# Patient Record
Sex: Female | Born: 1935 | Race: White | Hispanic: No | Smoking: Never smoker
Health system: Southern US, Community
[De-identification: ages and names within clinical notes are randomized; demographics above are authoritative.]

## PROBLEM LIST (undated history)

## (undated) DIAGNOSIS — R079 Chest pain, unspecified: Secondary | ICD-10-CM

## (undated) DIAGNOSIS — I6522 Occlusion and stenosis of left carotid artery: Secondary | ICD-10-CM

## (undated) DIAGNOSIS — K219 Gastro-esophageal reflux disease without esophagitis: Secondary | ICD-10-CM

## (undated) DIAGNOSIS — E079 Disorder of thyroid, unspecified: Secondary | ICD-10-CM

## (undated) DIAGNOSIS — F329 Major depressive disorder, single episode, unspecified: Secondary | ICD-10-CM

## (undated) DIAGNOSIS — I10 Essential (primary) hypertension: Secondary | ICD-10-CM

## (undated) DIAGNOSIS — F32A Depression, unspecified: Secondary | ICD-10-CM

## (undated) DIAGNOSIS — H269 Unspecified cataract: Secondary | ICD-10-CM

## (undated) HISTORY — DX: Disorder of thyroid, unspecified: E07.9

## (undated) HISTORY — PX: KNEE ARTHROSCOPY: SUR90

## (undated) HISTORY — DX: Chest pain, unspecified: R07.9

## (undated) HISTORY — PX: JOINT REPLACEMENT: SHX530

## (undated) HISTORY — DX: Gastro-esophageal reflux disease without esophagitis: K21.9

## (undated) HISTORY — PX: SPINE SURGERY: SHX786

## (undated) HISTORY — PX: ABDOMINAL HYSTERECTOMY: SHX81

## (undated) HISTORY — DX: Occlusion and stenosis of left carotid artery: I65.22

## (undated) HISTORY — PX: NOSE SURGERY: SHX723

## (undated) HISTORY — PX: LIPOMA EXCISION: SHX5283

## (undated) HISTORY — DX: Major depressive disorder, single episode, unspecified: F32.9

## (undated) HISTORY — DX: Unspecified cataract: H26.9

## (undated) HISTORY — DX: Depression, unspecified: F32.A

---
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
## 1997-08-16 ENCOUNTER — Ambulatory Visit (HOSPITAL_COMMUNITY): Admission: RE | Admit: 1997-08-16 | Discharge: 1997-08-17 | Payer: Self-pay | Admitting: Urology

## 1997-11-22 ENCOUNTER — Ambulatory Visit (HOSPITAL_BASED_OUTPATIENT_CLINIC_OR_DEPARTMENT_OTHER): Admission: RE | Admit: 1997-11-22 | Discharge: 1997-11-22 | Payer: Self-pay | Admitting: Urology

## 1998-10-17 ENCOUNTER — Other Ambulatory Visit: Admission: RE | Admit: 1998-10-17 | Discharge: 1998-10-17 | Payer: Self-pay | Admitting: Internal Medicine

## 1999-04-25 ENCOUNTER — Ambulatory Visit (HOSPITAL_COMMUNITY): Admission: RE | Admit: 1999-04-25 | Discharge: 1999-04-25 | Payer: Self-pay | Admitting: Gastroenterology

## 2002-09-10 ENCOUNTER — Encounter: Payer: Self-pay | Admitting: Internal Medicine

## 2002-09-10 ENCOUNTER — Ambulatory Visit (HOSPITAL_COMMUNITY): Admission: RE | Admit: 2002-09-10 | Discharge: 2002-09-10 | Payer: Self-pay | Admitting: Internal Medicine

## 2002-11-03 ENCOUNTER — Encounter: Payer: Self-pay | Admitting: General Surgery

## 2002-11-10 ENCOUNTER — Ambulatory Visit (HOSPITAL_COMMUNITY): Admission: RE | Admit: 2002-11-10 | Discharge: 2002-11-10 | Payer: Self-pay | Admitting: General Surgery

## 2010-06-23 DIAGNOSIS — I10 Essential (primary) hypertension: Secondary | ICD-10-CM

## 2010-06-23 HISTORY — DX: Essential (primary) hypertension: I10

## 2014-12-13 ENCOUNTER — Ambulatory Visit (INDEPENDENT_AMBULATORY_CARE_PROVIDER_SITE_OTHER): Payer: Medicare Other | Admitting: Emergency Medicine

## 2014-12-13 ENCOUNTER — Encounter: Payer: Self-pay | Admitting: Emergency Medicine

## 2014-12-13 VITALS — BP 148/84 | HR 93 | Temp 98.4°F | Resp 18 | Ht 63.0 in | Wt 211.5 lb

## 2014-12-13 DIAGNOSIS — N3090 Cystitis, unspecified without hematuria: Secondary | ICD-10-CM

## 2014-12-13 LAB — POCT URINALYSIS DIPSTICK
Bilirubin, UA: NEGATIVE
GLUCOSE UA: NEGATIVE
KETONES UA: NEGATIVE
Nitrite, UA: POSITIVE
SPEC GRAV UA: 1.01
UROBILINOGEN UA: 0.2
pH, UA: 6

## 2014-12-13 LAB — POCT UA - MICROSCOPIC ONLY
CRYSTALS, UR, HPF, POC: NEGATIVE
Casts, Ur, LPF, POC: NEGATIVE
Mucus, UA: NEGATIVE
Yeast, UA: NEGATIVE

## 2014-12-13 MED ORDER — PHENAZOPYRIDINE HCL 200 MG PO TABS: 200.0000 mg | ORAL_TABLET | Freq: Three times a day (TID) | ORAL | Status: DC | PRN

## 2014-12-13 MED ORDER — SULFAMETHOXAZOLE-TRIMETHOPRIM 800-160 MG PO TABS: 1.0000 | ORAL_TABLET | Freq: Two times a day (BID) | ORAL | Status: DC

## 2014-12-13 NOTE — Progress Notes (Signed)
Subjective:  Patient ID: Jean King, female    DOB: 06-25-1935  Age: 79 y.o. MRN: [id number]  CC: Back Pain and Dysuria   HPI Jean King presents  with dysuria urgency and frequency over several days. She now today has developed hematuria. She denies any fever chills nausea vomiting. History of prior urinary tract infections. She has no back pain. No vaginal bleeding no vaginal discharge. She's had no improvement with over-the-counter medication  History Jean King has a past medical history of Asthma; Cataract; Depression; GERD (gastroesophageal reflux disease); and Thyroid disease.   She has past surgical history that includes Spine surgery; Joint replacement; Abdominal hysterectomy; and Coronary artery bypass graft.   Her  family history includes Cancer in her mother and sister; Heart disease in her brother and father; Stroke in her father.  She   reports that she has never smoked. She does not have any smokeless tobacco history on file. Her alcohol and drug histories are not on file.  No outpatient prescriptions prior to visit.   No facility-administered medications prior to visit.    Social History   Social History  . Marital Status: Widowed    Spouse Name: N/A  . Number of Children: N/A  . Years of Education: N/A   Social History Main Topics  . Smoking status: Never Smoker   . Smokeless tobacco: None  . Alcohol Use: None  . Drug Use: None  . Sexual Activity: Not Asked   Other Topics Concern  . None   Social History Narrative  . None     Review of Systems  Constitutional: Negative for fever, chills and appetite change.  HENT: Negative for congestion, ear pain, postnasal drip, sinus pressure and sore throat.   Eyes: Negative for pain and redness.  Respiratory: Negative for cough, shortness of breath and wheezing.   Cardiovascular: Negative for leg swelling.  Gastrointestinal: Negative for nausea, vomiting, abdominal pain, diarrhea, constipation and blood  in stool.  Endocrine: Negative for polyuria.  Genitourinary: Positive for dysuria, urgency, frequency and hematuria. Negative for flank pain.  Musculoskeletal: Negative for gait problem.  Skin: Negative for rash.  Neurological: Negative for weakness and headaches.  Psychiatric/Behavioral: Negative for confusion and decreased concentration. The patient is not nervous/anxious.     Objective:  BP 148/84 mmHg  Pulse 93  Temp(Src) 98.4 F (36.9 C) (Oral)  Resp 18  Ht 5\' 3"  (1.6 m)  Wt 211 lb 7.5 oz (95.922 kg)  BMI 37.47 kg/m2  SpO2 92%  Physical Exam  Constitutional: She is oriented to person, place, and time. She appears well-developed and well-nourished. No distress.  HENT:  Head: Normocephalic and atraumatic.  Right Ear: External ear normal.  Left Ear: External ear normal.  Nose: Nose normal.  Eyes: Conjunctivae and EOM are normal. Pupils are equal, round, and reactive to light. No scleral icterus.  Neck: Normal range of motion. Neck supple. No tracheal deviation present.  Cardiovascular: Normal rate, regular rhythm and normal heart sounds.   Pulmonary/Chest: Effort normal. No respiratory distress. She has no wheezes. She has no rales.  Abdominal: She exhibits no mass. There is no tenderness. There is no rebound and no guarding.  Musculoskeletal: She exhibits no edema.  Lymphadenopathy:    She has no cervical adenopathy.  Neurological: She is alert and oriented to person, place, and time. Coordination normal.  Skin: Skin is warm and dry. No rash noted.  Psychiatric: She has a normal mood and affect. Her behavior is normal.  Assessment & Plan:   Jean King was seen today for back pain and dysuria.  Diagnoses and all orders for this visit:  Cystitis -     POCT UA - Microscopic Only -     POCT urinalysis dipstick  Other orders -     phenazopyridine (PYRIDIUM) 200 MG tablet; Take 1 tablet (200 mg total) by mouth 3 (three) times daily as needed. -      sulfamethoxazole-trimethoprim (BACTRIM DS,SEPTRA DS) 800-160 MG per tablet; Take 1 tablet by mouth 2 (two) times daily.   I am having Jean King start on phenazopyridine and sulfamethoxazole-trimethoprim. I am also having her maintain her SIMVASTATIN PO, gabapentin, LEVOTHYROXINE SODIUM PO, and Escitalopram Oxalate (LEXAPRO PO).  Meds ordered this encounter  Medications  . SIMVASTATIN PO    Sig: Take by mouth.  . gabapentin (NEURONTIN) 100 MG capsule    Sig: Take 100 mg by mouth 3 (three) times daily.  Marland Kitchen LEVOTHYROXINE SODIUM PO    Sig: Take by mouth.  . Escitalopram Oxalate (LEXAPRO PO)    Sig: Take by mouth.  . phenazopyridine (PYRIDIUM) 200 MG tablet    Sig: Take 1 tablet (200 mg total) by mouth 3 (three) times daily as needed.    Dispense:  6 tablet    Refill:  0  . sulfamethoxazole-trimethoprim (BACTRIM DS,SEPTRA DS) 800-160 MG per tablet    Sig: Take 1 tablet by mouth 2 (two) times daily.    Dispense:  20 tablet    Refill:  0    Appropriate red flag conditions were discussed with the patient as well as actions that should be taken.  Patient expressed his understanding.  Follow-up: No Follow-up on file.  Roselee Culver, MD    Results for orders placed or performed in visit on 12/13/14  POCT UA - Microscopic Only  Result Value Ref Range   WBC, Ur, HPF, POC TNTC    RBC, urine, microscopic 15-20    Bacteria, U Microscopic large    Mucus, UA neg    Epithelial cells, urine per micros 3-5    Crystals, Ur, HPF, POC neg    Casts, Ur, LPF, POC neg    Yeast, UA neg   POCT urinalysis dipstick  Result Value Ref Range   Color, UA yellow    Clarity, UA clear    Glucose, UA neg    Bilirubin, UA neg    Ketones, UA neg    Spec Grav, UA 1.010    Blood, UA large    pH, UA 6.0    Protein, UA trace    Urobilinogen, UA 0.2    Nitrite, UA positive    Leukocytes, UA moderate (2+) (A) Negative

## 2014-12-13 NOTE — Patient Instructions (Signed)

## 2015-05-22 ENCOUNTER — Other Ambulatory Visit: Payer: Self-pay | Admitting: Internal Medicine

## 2015-05-22 DIAGNOSIS — Z1231 Encounter for screening mammogram for malignant neoplasm of breast: Secondary | ICD-10-CM

## 2015-05-31 ENCOUNTER — Ambulatory Visit
Admission: RE | Admit: 2015-05-31 | Discharge: 2015-05-31 | Disposition: A | Discharge status: Home / Self Care | Payer: Medicare Other | Source: Ambulatory Visit | Attending: Internal Medicine | Admitting: Internal Medicine

## 2015-05-31 DIAGNOSIS — Z1231 Encounter for screening mammogram for malignant neoplasm of breast: Secondary | ICD-10-CM

## 2015-06-15 ENCOUNTER — Emergency Department (HOSPITAL_COMMUNITY): Payer: Medicare Other

## 2015-06-15 ENCOUNTER — Emergency Department (HOSPITAL_COMMUNITY)
Admission: EM | Admit: 2015-06-15 | Discharge: 2015-06-16 | Disposition: A | Discharge status: Home / Self Care | Payer: Medicare Other | Attending: Emergency Medicine | Admitting: Emergency Medicine

## 2015-06-15 ENCOUNTER — Encounter (HOSPITAL_COMMUNITY): Payer: Self-pay | Admitting: Emergency Medicine

## 2015-06-15 DIAGNOSIS — Z86711 Personal history of pulmonary embolism: Secondary | ICD-10-CM | POA: Diagnosis not present

## 2015-06-15 DIAGNOSIS — Z951 Presence of aortocoronary bypass graft: Secondary | ICD-10-CM | POA: Insufficient documentation

## 2015-06-15 DIAGNOSIS — Z8669 Personal history of other diseases of the nervous system and sense organs: Secondary | ICD-10-CM | POA: Insufficient documentation

## 2015-06-15 DIAGNOSIS — R0602 Shortness of breath: Secondary | ICD-10-CM

## 2015-06-15 DIAGNOSIS — Z8719 Personal history of other diseases of the digestive system: Secondary | ICD-10-CM | POA: Insufficient documentation

## 2015-06-15 DIAGNOSIS — Z79899 Other long term (current) drug therapy: Secondary | ICD-10-CM | POA: Insufficient documentation

## 2015-06-15 DIAGNOSIS — Z7982 Long term (current) use of aspirin: Secondary | ICD-10-CM | POA: Insufficient documentation

## 2015-06-15 DIAGNOSIS — J45901 Unspecified asthma with (acute) exacerbation: Secondary | ICD-10-CM | POA: Diagnosis not present

## 2015-06-15 DIAGNOSIS — E079 Disorder of thyroid, unspecified: Secondary | ICD-10-CM | POA: Insufficient documentation

## 2015-06-15 DIAGNOSIS — F329 Major depressive disorder, single episode, unspecified: Secondary | ICD-10-CM | POA: Insufficient documentation

## 2015-06-15 LAB — BASIC METABOLIC PANEL
Anion gap: 13 (ref 5–15)
BUN: 40 mg/dL — AB (ref 6–20)
CHLORIDE: 105 mmol/L (ref 101–111)
CO2: 23 mmol/L (ref 22–32)
CREATININE: 1.22 mg/dL — AB (ref 0.44–1.00)
Calcium: 10 mg/dL (ref 8.9–10.3)
GFR calc Af Amer: 48 mL/min — ABNORMAL LOW (ref >60)
GFR calc non Af Amer: 42 mL/min — ABNORMAL LOW (ref >60)
Glucose, Bld: 134 mg/dL — ABNORMAL HIGH (ref 65–99)
POTASSIUM: 4.5 mmol/L (ref 3.5–5.1)
SODIUM: 141 mmol/L (ref 135–145)

## 2015-06-15 LAB — CBC
HEMATOCRIT: 38.9 % (ref 36.0–46.0)
Hemoglobin: 13.1 g/dL (ref 12.0–15.0)
MCH: 30.5 pg (ref 26.0–34.0)
MCHC: 33.7 g/dL (ref 30.0–36.0)
MCV: 90.7 fL (ref 78.0–100.0)
PLATELETS: 354 10*3/uL (ref 150–400)
RBC: 4.29 MIL/uL (ref 3.87–5.11)
RDW: 13.5 % (ref 11.5–15.5)
WBC: 14.3 10*3/uL — AB (ref 4.0–10.5)

## 2015-06-15 LAB — I-STAT TROPONIN, ED: Troponin i, poc: 0 ng/mL (ref 0.00–0.08)

## 2015-06-15 MED ORDER — SODIUM CHLORIDE 0.9 % IV SOLN
Freq: Once | INTRAVENOUS | Status: AC
  Administered 2015-06-15: 22:00:00 via INTRAVENOUS

## 2015-06-15 MED ORDER — IOHEXOL 350 MG/ML SOLN
80.0000 mL | Freq: Once | INTRAVENOUS | Status: AC | PRN
  Administered 2015-06-15: 100 mL via INTRAVENOUS

## 2015-06-15 NOTE — ED Notes (Signed)
Pt stable, ambulatory, states understanding of discharge instructions 

## 2015-06-15 NOTE — ED Notes (Addendum)
Pt reports exertional dyspnea x 1 week. Pt reports that she was tested at her doctors for blood clots and the blood test came back positive, pt has hx of PE. Pt alert x4. NAD at this time. Pt also reports that her PCP reported that her kidneys weren't functioning right.

## 2015-06-15 NOTE — ED Notes (Signed)
Pt ambulated in hall. O2 Sat stayed within normal limits. Tolerated well.

## 2015-06-15 NOTE — ED Provider Notes (Signed)
CSN: [id number]     Arrival date & time 06/15/15  1416 History   First MD Initiated Contact with Patient 06/15/15 2120     Chief Complaint  Patient presents with  . Shortness of Breath     (Consider location/radiation/quality/duration/timing/severity/associated sxs/prior Treatment) HPI Comments: Patient is a 80 year old female with a history of asthma, esophageal reflux, and pulmonary embolus (hx 4 years ago, completed 6 mo course of coumadin). She presents to the emergency department for evaluation of dyspnea on exertion which has been present for the last week. Patient reports having worsening symptoms with ambulation. She also reports associated lightheadedness at times. She states that her symptoms reminded her of when she had a blood clot. She reports that her brother "died of a blood clot". Patient denies fever, chill, syncope, leg swelling, chest pain, N/V. She recently discontinued an Atkins diet which she had been doing x 2 weeks. She is unsure of whether this may be contributing to her symptoms.  Patient is a 80 y.o. female presenting with shortness of breath. The history is provided by the patient. No language interpreter was used.  Shortness of Breath   Past Medical History  Diagnosis Date  . Asthma   . Cataract   . Depression   . GERD (gastroesophageal reflux disease)   . Thyroid disease    Past Surgical History  Procedure Laterality Date  . Spine surgery    . Joint replacement    . Abdominal hysterectomy    . Coronary artery bypass graft     Family History  Problem Relation Age of Onset  . Cancer Mother   . Heart disease Father   . Stroke Father   . Cancer Sister   . Heart disease Brother    Social History  Substance Use Topics  . Smoking status: Never Smoker   . Smokeless tobacco: None  . Alcohol Use: None   OB History    No data available      Review of Systems  Respiratory: Positive for shortness of breath.   Neurological: Positive for  light-headedness.  All other systems reviewed and are negative.   Allergies  Chlorhexidine and Scopolamine  Home Medications   Prior to Admission medications   Medication Sig Start Date End Date Taking? Authorizing Provider  aspirin EC 81 MG tablet Take 81 mg by mouth at bedtime.   Yes Historical Provider, MD  ciprofloxacin (CIPRO) 500 MG tablet Take 500 mg by mouth 2 (two) times daily. 7 day course started 06/14/25 am   Yes Historical Provider, MD  escitalopram (LEXAPRO) 20 MG tablet Take 10 mg by mouth See admin instructions. Take 1/2 tablet (10 mg) every other day for approx one more week (06/15/15), pt is tapering down and off this medication   Yes Historical Provider, MD  gabapentin (NEURONTIN) 100 MG capsule Take 100 mg by mouth 3 (three) times daily.   Yes Historical Provider, MD  ibuprofen (ADVIL,MOTRIN) 200 MG tablet Take 400-600 mg by mouth at bedtime.   Yes Historical Provider, MD  levothyroxine (SYNTHROID, LEVOTHROID) 75 MCG tablet Take 75 mcg by mouth daily before breakfast. 05/24/15  Yes Historical Provider, MD  Multiple Vitamin (MULTIVITAMIN WITH MINERALS) TABS tablet Take 1 tablet by mouth daily. Centrum Silver Women over 50   Yes Historical Provider, MD  predniSONE (DELTASONE) 10 MG tablet Take 10-60 mg by mouth daily at 3 pm. Take 6 tablets (60 mg) by mouth 1st day (spaced throughout the day per pkg directions), take 5 tablets (  50 mg) 2nd day (spaced throughout the day), take 4 tablets (40 mg) 3rd day (spaced throughout the day), take 3 tablets (30 mg) 4th day (spaced throughout the day), then take 2 tablets (20 mg) 5th day, then take 1 tablet (10 mg) 6th day.   Yes Historical Provider, MD  simvastatin (ZOCOR) 20 MG tablet Take 20 mg by mouth at bedtime.   Yes Historical Provider, MD   BP 127/75 mmHg  Pulse 76  Temp(Src) 98.1 F (36.7 C) (Oral)  Resp 15  SpO2 99%   Physical Exam  Constitutional: She is oriented to person, place, and time. She appears well-developed and  well-nourished. No distress.  Nontoxic/nonseptic appearing. Pleasant.  HENT:  Head: Normocephalic and atraumatic.  Eyes: Conjunctivae and EOM are normal. No scleral icterus.  Neck: Normal range of motion.  Cardiovascular: Normal rate, regular rhythm and intact distal pulses.   Pulmonary/Chest: Effort normal and breath sounds normal. No respiratory distress. She has no wheezes. She has no rales.  No dyspnea or tachypnea. Lungs clear bilaterally.  Musculoskeletal: Normal range of motion.  Neurological: She is alert and oriented to person, place, and time. She exhibits normal muscle tone. Coordination normal.  Patient moving all extremities  Skin: Skin is warm and dry. No rash noted. She is not diaphoretic. No erythema. No pallor.  Psychiatric: She has a normal mood and affect. Her behavior is normal.  Nursing note and vitals reviewed.   ED Course  Procedures (including critical care time) Labs Review Labs Reviewed  BASIC METABOLIC PANEL - Abnormal; Notable for the following:    Glucose, Bld 134 (*)    BUN 40 (*)    Creatinine, Ser 1.22 (*)    GFR calc non Af Amer 42 (*)    GFR calc Af Amer 48 (*)    All other components within normal limits  CBC - Abnormal; Notable for the following:    WBC 14.3 (*)    All other components within normal limits  I-STAT TROPOININ, ED    Imaging Review Dg Chest 2 View  06/15/2015  CLINICAL DATA:  Shortness of breath and palpitations. Dyspnea for 1 week. EXAM: CHEST  2 VIEW COMPARISON:  None. FINDINGS: The heart size and mediastinal contours are within normal limits. Both lungs are clear. The visualized skeletal structures are unremarkable. IMPRESSION: No active cardiopulmonary disease. Electronically Signed   By: Kathreen Devoid   On: 06/15/2015 15:54   Ct Angio Chest Pe W/cm &/or Wo Cm  06/15/2015  CLINICAL DATA:  80 year old female with dyspnea and elevated D-dimer. EXAM: CT ANGIOGRAPHY CHEST WITH CONTRAST TECHNIQUE: Multidetector CT imaging of the  chest was performed using the standard protocol during bolus administration of intravenous contrast. Multiplanar CT image reconstructions and MIPs were obtained to evaluate the vascular anatomy. CONTRAST:  161mL OMNIPAQUE IOHEXOL 350 MG/ML SOLN COMPARISON:  Radiograph dated 06/15/2015 FINDINGS: There is mild diffuse ground-glass density throughout the lungs, likely atelectatic changes. There is no focal consolidation, pleural effusion, or pneumothorax. The central airways are patent. The thoracic aorta appears unremarkable. No CT evidence of pulmonary embolism. Top-normal cardiac size. There is no pericardial effusion. There is no hilar or mediastinal adenopathy. The esophagus is grossly unremarkable. No thyroid nodule identified. There is no axillary adenopathy. The chest wall soft tissues appear unremarkable. There is degenerative changes of the spine. There is compression fracture of the superior endplate of the 624THL vertebra, age indeterminate, likely old. Grade 1 T2-T3 anterolisthesis. No definite acute fracture. Review of the MIP  images confirms the above findings. IMPRESSION: No CT evidence of pulmonary embolism. Electronically Signed   By: Anner Crete M.D.   On: 06/15/2015 23:02   I have personally reviewed and evaluated these images and lab results as part of my medical decision-making.   EKG Interpretation   Date/Time:  Friday June 15 2015 15:22:08 EST Ventricular Rate:  71 PR Interval:  130 QRS Duration: 82 QT Interval:  378 QTC Calculation: 410 R Axis:     Text Interpretation:  Normal sinus rhythm Normal ECG No significant change  since last tracing Confirmed by Winfred Leeds  MD, SAM [phone]) on 06/15/2015  11:27:10 PM      MDM   Final diagnoses:  Shortness of breath    80 year old female presents to the emergency department for evaluation of dyspnea on exertion over the past week. She came to the emergency department after having a positive d-dimer result performed by her  primary care doctor. Patient hemodynamically stable without tachycardia or hypoxia while in the ED. CT scan obtained which shows no evidence of pulmonary embolus.  Patient with unchanged EKG compared to prior as well as a negative troponin. Given her dyspnea on exertion without complaints of chest pain with exertion or rest, will refer to patient's primary care doctor for further workup. Patient has also been given a referral to cardiology as she would be a good candidate for a stress test and/or echocardiogram to further work up her symptoms. This has been communicated to the patient who verbalizes understanding. Patient stable for discharge at this time and return precautions given. Patient discharged in good condition with no unaddressed concerns.   Filed Vitals:   06/15/15 2230 06/15/15 2315 06/15/15 2330 06/15/15 2345  BP: 125/83 137/73 151/84 127/75  Pulse: 78 80 81 76  Temp:      TempSrc:      Resp: 16 17 19 15   SpO2: 100% 97% 97% 99%     Antonietta Breach, PA-C 06/16/15 0015  Orlie Dakin, MD 06/16/15 [id number]

## 2015-06-15 NOTE — Discharge Instructions (Signed)

## 2015-06-16 NOTE — ED Provider Notes (Addendum)
Complains of dyspnea on exertion for the past 4 days. Denies chest pain denies cough symptoms improved with rest. Patient presently asymptomatic. Patient was told by physicians assistant at Dr. Geraldo Docker office earlier today that "her kidneys weren't functioning right" patient has told me that her creatinine was mildly elevated when seen by prior family physician but creatinine was mildly elevated and patient is in no distress. Lungs clear to auscultation heart regular rate and rhythm extremities without edema  Orlie Dakin, MD 06/16/15 0006  Orlie Dakin, MD 06/16/15 [id number]

## 2016-09-28 ENCOUNTER — Encounter (HOSPITAL_COMMUNITY): Payer: Self-pay | Admitting: *Deleted

## 2016-09-28 ENCOUNTER — Ambulatory Visit (HOSPITAL_COMMUNITY)
Admission: EM | Admit: 2016-09-28 | Discharge: 2016-09-28 | Disposition: A | Discharge status: Home / Self Care | Payer: Medicare Other | Attending: Family Medicine | Admitting: Family Medicine

## 2016-09-28 DIAGNOSIS — K219 Gastro-esophageal reflux disease without esophagitis: Secondary | ICD-10-CM | POA: Insufficient documentation

## 2016-09-28 DIAGNOSIS — Z8249 Family history of ischemic heart disease and other diseases of the circulatory system: Secondary | ICD-10-CM | POA: Diagnosis not present

## 2016-09-28 DIAGNOSIS — E079 Disorder of thyroid, unspecified: Secondary | ICD-10-CM | POA: Insufficient documentation

## 2016-09-28 DIAGNOSIS — F329 Major depressive disorder, single episode, unspecified: Secondary | ICD-10-CM | POA: Diagnosis not present

## 2016-09-28 DIAGNOSIS — N390 Urinary tract infection, site not specified: Secondary | ICD-10-CM | POA: Insufficient documentation

## 2016-09-28 DIAGNOSIS — Z823 Family history of stroke: Secondary | ICD-10-CM | POA: Diagnosis not present

## 2016-09-28 DIAGNOSIS — Z888 Allergy status to other drugs, medicaments and biological substances status: Secondary | ICD-10-CM | POA: Insufficient documentation

## 2016-09-28 DIAGNOSIS — J45909 Unspecified asthma, uncomplicated: Secondary | ICD-10-CM | POA: Insufficient documentation

## 2016-09-28 LAB — POCT URINALYSIS DIP (DEVICE)
Bilirubin Urine: NEGATIVE
GLUCOSE, UA: NEGATIVE mg/dL
Ketones, ur: NEGATIVE mg/dL
Nitrite: NEGATIVE
PH: 6.5 (ref 5.0–8.0)
PROTEIN: 30 mg/dL — AB
Specific Gravity, Urine: 1.015 (ref 1.005–1.030)
UROBILINOGEN UA: 0.2 mg/dL (ref 0.0–1.0)

## 2016-09-28 MED ORDER — CEPHALEXIN 500 MG PO CAPS: 500.0000 mg | ORAL_CAPSULE | Freq: Three times a day (TID) | ORAL | 0 refills | Status: DC

## 2016-09-28 NOTE — ED Provider Notes (Signed)
CSN: [id number]     Arrival date & time 09/28/16  1525 History   None    Chief Complaint  Patient presents with  . Urinary Tract Infection   (Consider location/radiation/quality/duration/timing/severity/associated sxs/prior Treatment)  HPI   The patient is a 81 year old female presenting today with complaints for urinary tract infection that started this past Thursday. Patient states she's had frequent infections over the past 2 years averaging a urinary tract infection every other month. Patient states her last round of antibiotics was right before Christmas. States that at times she "feels them starting" she increases her water intake and take some ibuprofen. Patient states she did this this past Thursday but Friday evening she started with the chills and the lower back cramps. Patient states yesterday evening she started to notice the blood in her urine.  Past Medical History:  Diagnosis Date  . Asthma   . Cataract   . Depression   . GERD (gastroesophageal reflux disease)   . Thyroid disease    Past Surgical History:  Procedure Laterality Date  . ABDOMINAL HYSTERECTOMY    . CORONARY ARTERY BYPASS GRAFT    . JOINT REPLACEMENT    . SPINE SURGERY     Family History  Problem Relation Age of Onset  . Cancer Mother   . Heart disease Father   . Stroke Father   . Cancer Sister   . Heart disease Brother    Social History  Substance Use Topics  . Smoking status: Never Smoker  . Smokeless tobacco: Never Used  . Alcohol use No   OB History    No data available     Review of Systems  Constitutional: Positive for chills. Negative for fatigue and fever.  HENT: Negative.   Eyes: Negative.  Negative for visual disturbance.  Respiratory: Negative.  Negative for cough and shortness of breath.   Cardiovascular: Negative.  Negative for chest pain and leg swelling.  Gastrointestinal: Negative.   Endocrine: Negative.   Genitourinary: Positive for frequency, hematuria and urgency.  Negative for difficulty urinating, flank pain, pelvic pain, vaginal bleeding, vaginal discharge and vaginal pain.       The patient states that she has been prescribed hormone vaginal cream that she is not currently using.  Musculoskeletal: Positive for back pain. Negative for gait problem and neck stiffness.  Skin: Negative.   Allergic/Immunologic: Negative.   Neurological: Negative.  Negative for dizziness and headaches.  Hematological: Negative.   Psychiatric/Behavioral: Negative.     Allergies  Chlorhexidine and Scopolamine  Home Medications   Prior to Admission medications   Medication Sig Start Date End Date Taking? Authorizing Provider  aspirin EC 81 MG tablet Take 81 mg by mouth at bedtime.    [provider]  cephALEXin (KEFLEX) 500 MG capsule Take 1 capsule (500 mg total) by mouth 3 (three) times daily. 09/28/16   Nehemiah Settle, NP  ciprofloxacin (CIPRO) 500 MG tablet Take 500 mg by mouth 2 (two) times daily. 7 day course started 06/14/25 am    [provider]  escitalopram (LEXAPRO) 20 MG tablet Take 10 mg by mouth See admin instructions. Take 1/2 tablet (10 mg) every other day for approx one more week (06/15/15), pt is tapering down and off this medication    [provider]  gabapentin (NEURONTIN) 100 MG capsule Take 100 mg by mouth 3 (three) times daily.    [provider]  ibuprofen (ADVIL,MOTRIN) 200 MG tablet Take 400-600 mg by mouth at bedtime.  [provider]  levothyroxine (SYNTHROID, LEVOTHROID) 75 MCG tablet Take 75 mcg by mouth daily before breakfast. 05/24/15   [provider]  Multiple Vitamin (MULTIVITAMIN WITH MINERALS) TABS tablet Take 1 tablet by mouth daily. Centrum Silver Women over 50    [provider]  predniSONE (DELTASONE) 10 MG tablet Take 10-60 mg by mouth daily at 3 pm. Take 6 tablets (60 mg) by mouth 1st day (spaced throughout the day per pkg directions), take 5 tablets (50 mg) 2nd day  (spaced throughout the day), take 4 tablets (40 mg) 3rd day (spaced throughout the day), take 3 tablets (30 mg) 4th day (spaced throughout the day), then take 2 tablets (20 mg) 5th day, then take 1 tablet (10 mg) 6th day.    [provider]  simvastatin (ZOCOR) 20 MG tablet Take 20 mg by mouth at bedtime.    [provider]   Meds Ordered and Administered this Visit  Medications - No data to display  BP (!) 153/82 (BP Location: Right Arm)   Pulse 70   Temp 98.3 F (36.8 C) (Oral)   Resp 18   SpO2 98%  No data found.   Physical Exam  Constitutional: She appears well-developed and well-nourished. No distress.  Neck: No thyromegaly present.  Cardiovascular: Normal rate, regular rhythm, normal heart sounds and intact distal pulses.  Exam reveals no gallop and no friction rub.   No murmur heard. Pulmonary/Chest: Effort normal and breath sounds normal. No respiratory distress. She has no wheezes. She has no rales. She exhibits no tenderness.  Abdominal: Soft. Bowel sounds are normal. She exhibits no distension and no mass. There is no tenderness. There is no rebound and no guarding. No hernia.  Negative CVA tenderness.  Skin: Skin is warm and dry. She is not diaphoretic.  Nursing note and vitals reviewed.   Urgent Care Course     Procedures (including critical care time)  Labs Review Labs Reviewed  POCT URINALYSIS DIP (DEVICE) - Abnormal; Notable for the following:       Result Value   Hgb urine dipstick LARGE (*)    Protein, ur 30 (*)    Leukocytes, UA LARGE (*)    All other components within normal limits  URINE CULTURE   Results for orders placed or performed during the hospital encounter of 09/28/16  POCT urinalysis dip (device)  Result Value Ref Range   Glucose, UA NEGATIVE NEGATIVE mg/dL   Bilirubin Urine NEGATIVE NEGATIVE   Ketones, ur NEGATIVE NEGATIVE mg/dL   Specific Gravity, Urine 1.015 1.005 - 1.030   Hgb urine dipstick LARGE (A) NEGATIVE   pH  6.5 5.0 - 8.0   Protein, ur 30 (A) NEGATIVE mg/dL   Urobilinogen, UA 0.2 0.0 - 1.0 mg/dL   Nitrite NEGATIVE NEGATIVE   Leukocytes, UA LARGE (A) NEGATIVE    Imaging Review No results found.  Discussed with patient the possible contribution of vaginal atrophy to frequent urinary tract infections. Patient is to restart using her vaginal hormone cream and if no improvement over the next several months with frequency of UTIs to follow-up with OB/GYN of her choice.  Urine culture pending - ordered due to frequent UTIs and abx use.  Discussed with Dr. Joseph Art.   MDM   1. Lower urinary tract infectious disease    Meds ordered this encounter  Medications  . cephALEXin (KEFLEX) 500 MG capsule    Sig: Take 1 capsule (500 mg total) by mouth 3 (three) times daily.  Dispense:  30 capsule    Refill:  0   The usual and customary discharge instructions and warnings were given.  The patient verbalizes understanding and agrees to plan of care.       Nehemiah Settle, NP 09/28/16 [phone]

## 2016-09-28 NOTE — Discharge Instructions (Signed)
Start using your vaginal hormone cream to see if you can help with atrophy as a possible cause of frequent infections.  If infections persist, follow up with GYN doctor of your choice.

## 2016-09-28 NOTE — ED Triage Notes (Signed)
Pt  Reports   Urinary  Frequency  Back  Pain      Blood  In  Urine         Pain  When  She  Urinates       Symptoms    X  3  Days       Pt  She  Has  Drinking  Lots   Water

## 2016-09-29 LAB — URINE CULTURE
Culture: <10000 — AB
Special Requests: NORMAL

## 2017-06-10 ENCOUNTER — Ambulatory Visit
Admission: RE | Admit: 2017-06-10 | Discharge: 2017-06-10 | Disposition: A | Discharge status: Home / Self Care | Payer: Medicare Other | Source: Ambulatory Visit | Attending: Internal Medicine | Admitting: Internal Medicine

## 2017-06-10 ENCOUNTER — Other Ambulatory Visit: Payer: Self-pay | Admitting: Internal Medicine

## 2017-06-10 DIAGNOSIS — R0789 Other chest pain: Secondary | ICD-10-CM

## 2017-06-15 ENCOUNTER — Other Ambulatory Visit: Payer: Self-pay | Admitting: Internal Medicine

## 2017-06-15 DIAGNOSIS — Z1231 Encounter for screening mammogram for malignant neoplasm of breast: Secondary | ICD-10-CM

## 2017-07-07 ENCOUNTER — Ambulatory Visit
Admission: RE | Admit: 2017-07-07 | Discharge: 2017-07-07 | Disposition: A | Discharge status: Home / Self Care | Payer: Medicare Other | Source: Ambulatory Visit | Attending: Internal Medicine | Admitting: Internal Medicine

## 2017-07-07 ENCOUNTER — Other Ambulatory Visit: Payer: Self-pay | Admitting: Internal Medicine

## 2017-07-07 DIAGNOSIS — R109 Unspecified abdominal pain: Secondary | ICD-10-CM

## 2017-07-07 DIAGNOSIS — Z1231 Encounter for screening mammogram for malignant neoplasm of breast: Secondary | ICD-10-CM

## 2017-10-27 ENCOUNTER — Other Ambulatory Visit: Payer: Self-pay | Admitting: Internal Medicine

## 2017-10-27 ENCOUNTER — Ambulatory Visit
Admission: RE | Admit: 2017-10-27 | Discharge: 2017-10-27 | Disposition: A | Discharge status: Home / Self Care | Payer: Medicare Other | Source: Ambulatory Visit | Attending: Internal Medicine | Admitting: Internal Medicine

## 2017-10-27 DIAGNOSIS — M25552 Pain in left hip: Secondary | ICD-10-CM

## 2017-10-27 DIAGNOSIS — R270 Ataxia, unspecified: Secondary | ICD-10-CM

## 2017-10-27 DIAGNOSIS — R0609 Other forms of dyspnea: Secondary | ICD-10-CM

## 2017-10-28 ENCOUNTER — Telehealth: Payer: Self-pay

## 2017-10-28 NOTE — Telephone Encounter (Signed)
Notes sent to [facility]

## 2017-11-04 ENCOUNTER — Other Ambulatory Visit (HOSPITAL_COMMUNITY): Payer: Self-pay | Admitting: Internal Medicine

## 2017-11-04 DIAGNOSIS — R0609 Other forms of dyspnea: Secondary | ICD-10-CM

## 2017-11-12 ENCOUNTER — Ambulatory Visit (HOSPITAL_COMMUNITY): Payer: Medicare Other | Attending: Cardiology

## 2017-11-12 ENCOUNTER — Other Ambulatory Visit: Payer: Self-pay

## 2017-11-12 ENCOUNTER — Ambulatory Visit
Admission: RE | Admit: 2017-11-12 | Discharge: 2017-11-12 | Disposition: A | Discharge status: Home / Self Care | Payer: Medicare Other | Source: Ambulatory Visit | Attending: Internal Medicine | Admitting: Internal Medicine

## 2017-11-12 DIAGNOSIS — R0609 Other forms of dyspnea: Secondary | ICD-10-CM | POA: Insufficient documentation

## 2017-11-12 DIAGNOSIS — J45909 Unspecified asthma, uncomplicated: Secondary | ICD-10-CM | POA: Insufficient documentation

## 2017-11-12 DIAGNOSIS — Z86711 Personal history of pulmonary embolism: Secondary | ICD-10-CM | POA: Insufficient documentation

## 2017-11-12 DIAGNOSIS — E079 Disorder of thyroid, unspecified: Secondary | ICD-10-CM | POA: Insufficient documentation

## 2017-11-12 DIAGNOSIS — R270 Ataxia, unspecified: Secondary | ICD-10-CM

## 2017-11-12 IMAGING — CT CT HEAD W/O CM
3 of 4 series · 15 of 47 positions shown, 18 images · non-contrast
Comparison: None.

CLINICAL DATA: Ataxia for 2-3 months.  Mild intermittent headaches.

EXAM:
CT HEAD WITHOUT CONTRAST
TECHNIQUE: Contiguous axial images were obtained from the base of the skull
through the vertex without intravenous contrast.

[Series 2: head 5.00 hr40 s3 ibhc · axial · 0.41mm/px · z∈[-512,-387]mm · 9 of 31 slices shown, 12 images]
[im 3/31  brain]
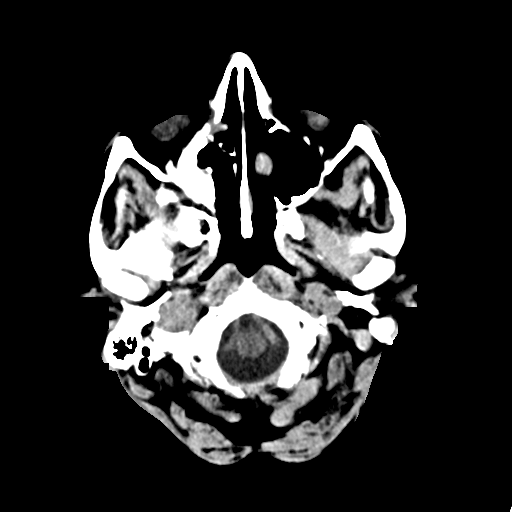
[im 3/31  bone]
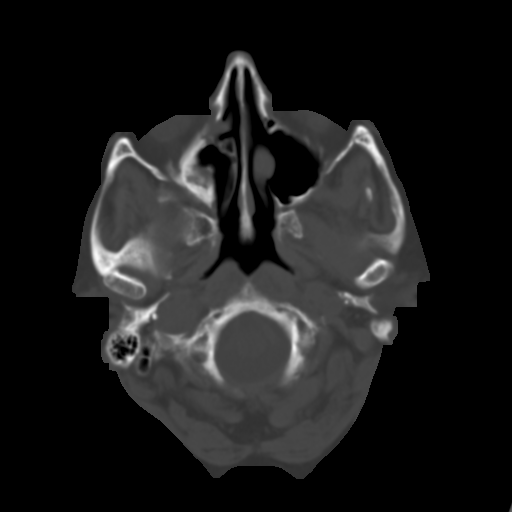
[im 7/31  brain]
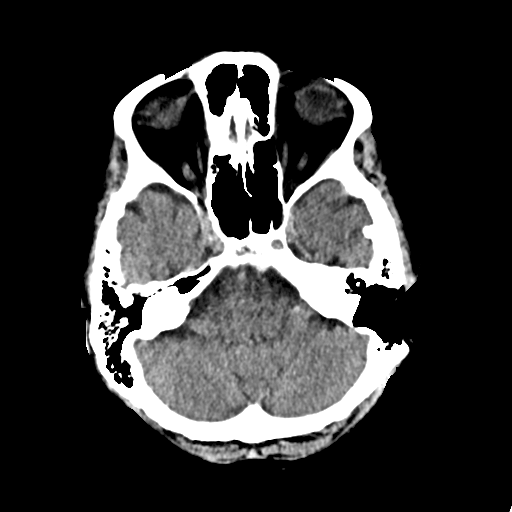
[im 9/31  brain]
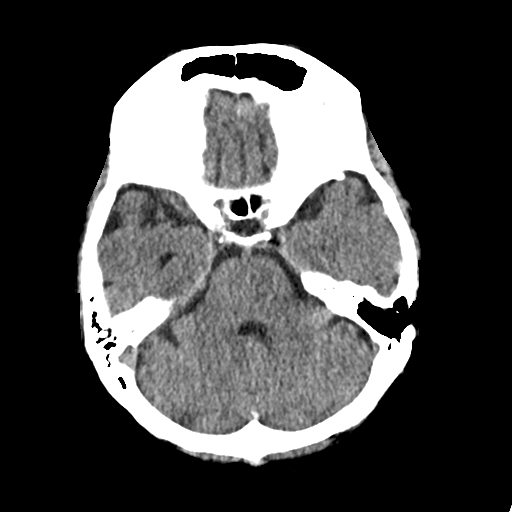
[im 13/31  brain]
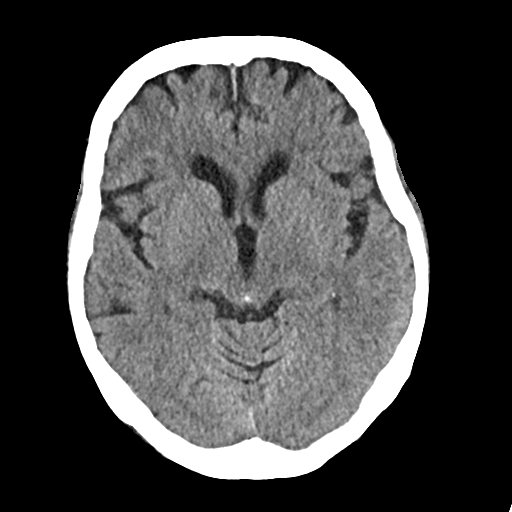
[im 16/31  brain]
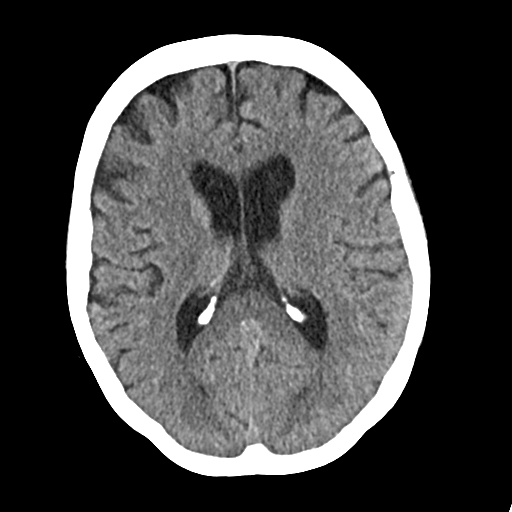
[im 16/31  bone]
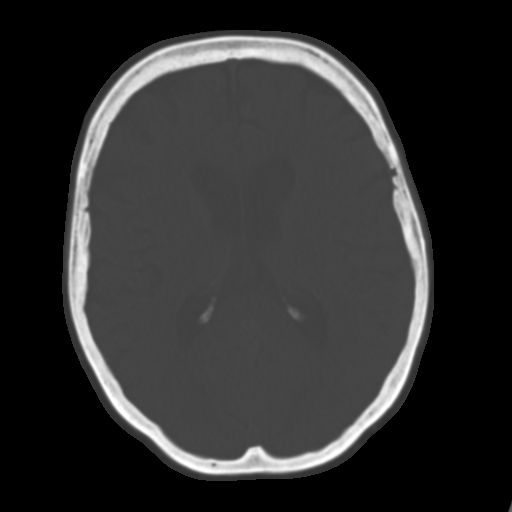
[im 18/31  brain]
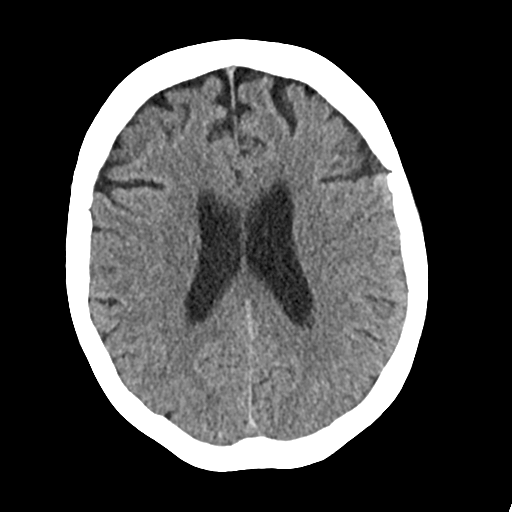
[im 22/31  brain]
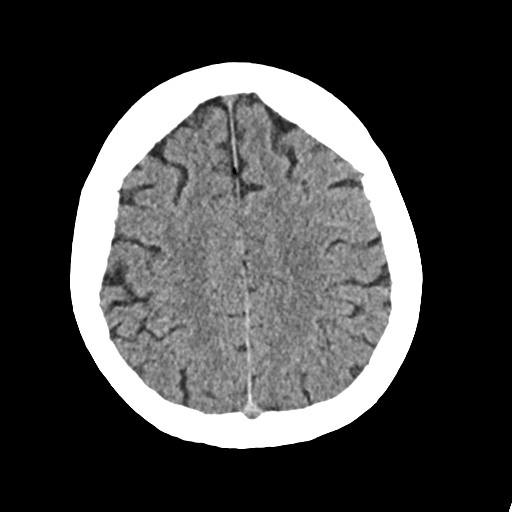
[im 24/31  brain]
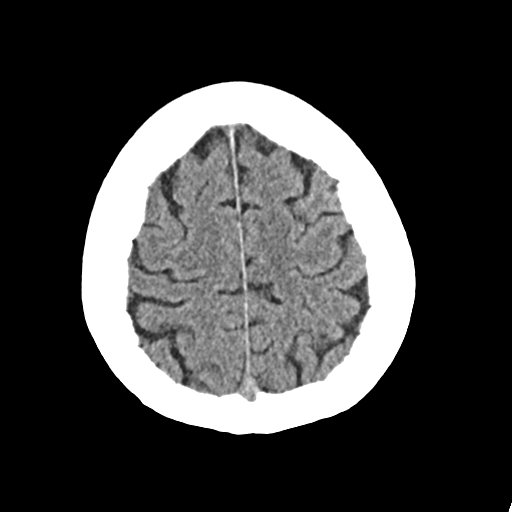
[im 28/31  brain]
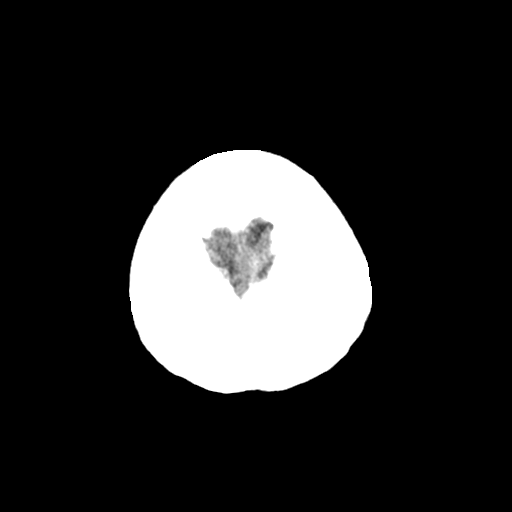
[im 28/31  bone]
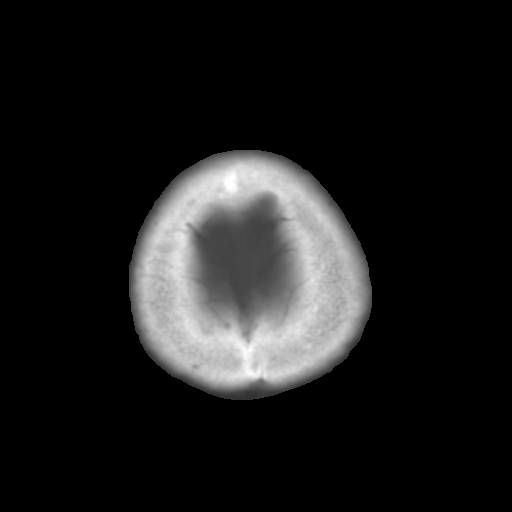

[Series 4: head 3.00 hr40 s3 sag · sagittal · 0.31mm/px · 3 of 58 slices shown]
[im 20/58  brain]
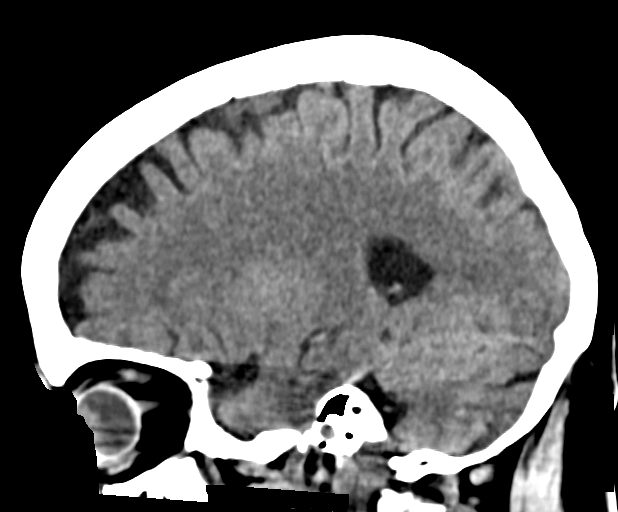
[im 29/58  brain]
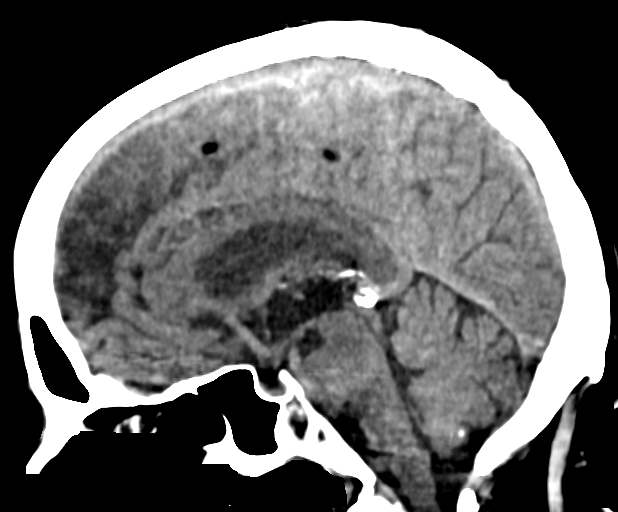
[im 39/58  brain]
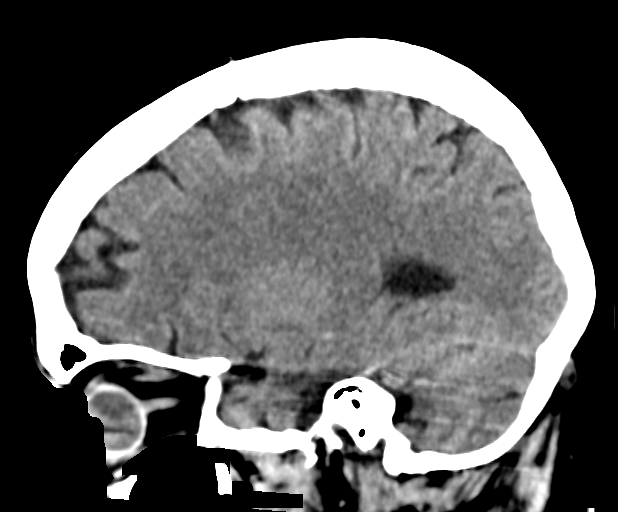

[Series 6: head 3.00 hr40 s3 cor · coronal · 0.31mm/px · 3 of 63 slices shown]
[im 21/63  brain]
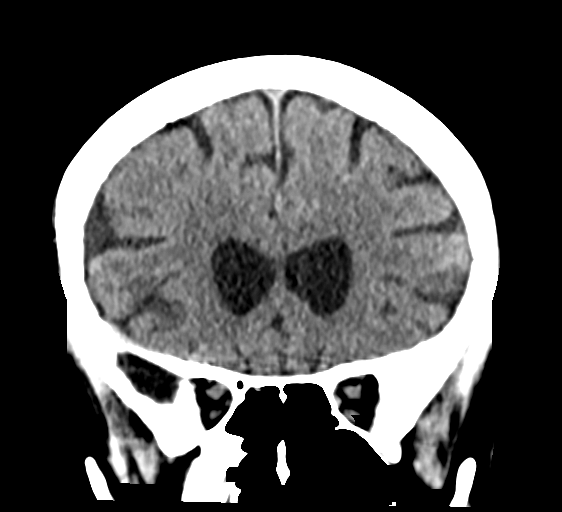
[im 28/63  brain]
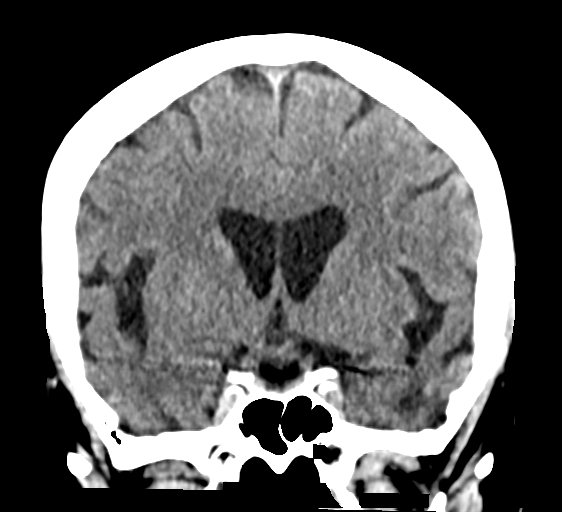
[im 35/63  brain]
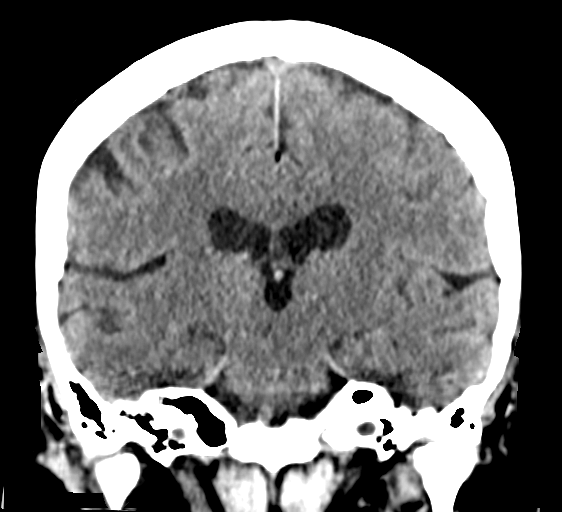

[15 of 47 positions shown; findings below may reference images not displayed]

FINDINGS: Brain: No evidence for acute infarction, hemorrhage, mass lesion,
hydrocephalus, or extra-axial fluid. Normal for age cerebral volume.
No significant white matter disease.

Vascular: Calcification of the cavernous internal carotid arteries
consistent with cerebrovascular atherosclerotic disease. No signs of
intracranial large vessel occlusion.

Skull: Calvarium is intact.

Sinuses/Orbits: Changes of significant longstanding chronic
inflammation affecting the RIGHT maxillary sinus, incompletely
evaluated, with osseous reaction/osteitis, and mucosal thickening.
Possible layering fluid suggesting acuity. There has been BILATERAL
maxillary sinus surgery, and as best can be assessed, no residual
barriers to drainage. Mild RIGHT ethmoid sinus fluid also noted,
with apparent prior BILATERAL ethmoidectomies. No frontal sinus
fluid. Sphenoid sinus both divisions are clear. BILATERAL cataract
extraction. No orbital masses.

Other: LEFT mastoidectomy. No middle ear or mastoid fluid on the
RIGHT or LEFT. Negative nasopharynx.
IMPRESSION: CT of the head demonstrates no acute or focal intracranial
abnormality. No intracranial cause for the patient's ataxia is
observed, although sinus disease could be contributory with regard
to headaches.

Changes of chronic RIGHT maxillary sinusitis, and incompletely
evaluated postsurgical change of the BILATERAL maxillary and ethmoid
sinuses. Consider CT of the paranasal sinuses for further
evaluation.

## 2018-02-15 ENCOUNTER — Emergency Department (HOSPITAL_COMMUNITY): Payer: Medicare Other

## 2018-02-15 ENCOUNTER — Other Ambulatory Visit: Payer: Self-pay

## 2018-02-15 ENCOUNTER — Inpatient Hospital Stay (HOSPITAL_COMMUNITY)
Admission: EM | Admit: 2018-02-15 | Discharge: 2018-02-18 | DRG: 175 | Disposition: A | Discharge status: Home Health | Payer: Medicare Other | Attending: Internal Medicine | Admitting: Internal Medicine

## 2018-02-15 ENCOUNTER — Encounter (HOSPITAL_COMMUNITY): Payer: Self-pay

## 2018-02-15 ENCOUNTER — Ambulatory Visit (HOSPITAL_COMMUNITY)
Admission: EM | Admit: 2018-02-15 | Discharge: 2018-02-15 | Disposition: A | Discharge status: Home / Self Care | Payer: Medicare Other | Source: Home / Self Care

## 2018-02-15 DIAGNOSIS — Z7951 Long term (current) use of inhaled steroids: Secondary | ICD-10-CM

## 2018-02-15 DIAGNOSIS — Z90710 Acquired absence of both cervix and uterus: Secondary | ICD-10-CM | POA: Diagnosis not present

## 2018-02-15 DIAGNOSIS — G500 Trigeminal neuralgia: Secondary | ICD-10-CM | POA: Diagnosis present

## 2018-02-15 DIAGNOSIS — K219 Gastro-esophageal reflux disease without esophagitis: Secondary | ICD-10-CM | POA: Diagnosis present

## 2018-02-15 DIAGNOSIS — Z79890 Hormone replacement therapy (postmenopausal): Secondary | ICD-10-CM | POA: Diagnosis not present

## 2018-02-15 DIAGNOSIS — I2699 Other pulmonary embolism without acute cor pulmonale: Secondary | ICD-10-CM | POA: Diagnosis not present

## 2018-02-15 DIAGNOSIS — E039 Hypothyroidism, unspecified: Secondary | ICD-10-CM | POA: Diagnosis present

## 2018-02-15 DIAGNOSIS — F32A Depression, unspecified: Secondary | ICD-10-CM | POA: Diagnosis present

## 2018-02-15 DIAGNOSIS — Z86711 Personal history of pulmonary embolism: Secondary | ICD-10-CM | POA: Diagnosis not present

## 2018-02-15 DIAGNOSIS — F329 Major depressive disorder, single episode, unspecified: Secondary | ICD-10-CM | POA: Diagnosis present

## 2018-02-15 DIAGNOSIS — Z8249 Family history of ischemic heart disease and other diseases of the circulatory system: Secondary | ICD-10-CM

## 2018-02-15 DIAGNOSIS — H9190 Unspecified hearing loss, unspecified ear: Secondary | ICD-10-CM | POA: Diagnosis present

## 2018-02-15 DIAGNOSIS — Z888 Allergy status to other drugs, medicaments and biological substances status: Secondary | ICD-10-CM

## 2018-02-15 DIAGNOSIS — I2609 Other pulmonary embolism with acute cor pulmonale: Secondary | ICD-10-CM | POA: Diagnosis present

## 2018-02-15 DIAGNOSIS — Z86718 Personal history of other venous thrombosis and embolism: Secondary | ICD-10-CM

## 2018-02-15 DIAGNOSIS — N3281 Overactive bladder: Secondary | ICD-10-CM | POA: Diagnosis present

## 2018-02-15 DIAGNOSIS — E876 Hypokalemia: Secondary | ICD-10-CM

## 2018-02-15 DIAGNOSIS — Z885 Allergy status to narcotic agent status: Secondary | ICD-10-CM | POA: Diagnosis not present

## 2018-02-15 DIAGNOSIS — Z823 Family history of stroke: Secondary | ICD-10-CM

## 2018-02-15 DIAGNOSIS — Z809 Family history of malignant neoplasm, unspecified: Secondary | ICD-10-CM | POA: Diagnosis not present

## 2018-02-15 DIAGNOSIS — E785 Hyperlipidemia, unspecified: Secondary | ICD-10-CM | POA: Diagnosis present

## 2018-02-15 DIAGNOSIS — I2694 Multiple subsegmental pulmonary emboli without acute cor pulmonale: Secondary | ICD-10-CM | POA: Diagnosis not present

## 2018-02-15 DIAGNOSIS — F419 Anxiety disorder, unspecified: Secondary | ICD-10-CM | POA: Diagnosis present

## 2018-02-15 DIAGNOSIS — I361 Nonrheumatic tricuspid (valve) insufficiency: Secondary | ICD-10-CM | POA: Diagnosis not present

## 2018-02-15 DIAGNOSIS — R0602 Shortness of breath: Secondary | ICD-10-CM | POA: Diagnosis present

## 2018-02-15 LAB — CBC WITH DIFFERENTIAL/PLATELET
ABS IMMATURE GRANULOCYTES: 0.05 10*3/uL (ref 0.00–0.07)
Basophils Absolute: 0.1 10*3/uL (ref 0.0–0.1)
Basophils Relative: 1 %
Eosinophils Absolute: 0.3 10*3/uL (ref 0.0–0.5)
Eosinophils Relative: 4 %
HEMATOCRIT: 37.5 % (ref 36.0–46.0)
HEMOGLOBIN: 12.1 g/dL (ref 12.0–15.0)
Immature Granulocytes: 1 %
LYMPHS ABS: 1.6 10*3/uL (ref 0.7–4.0)
LYMPHS PCT: 22 %
MCH: 30.5 pg (ref 26.0–34.0)
MCHC: 32.3 g/dL (ref 30.0–36.0)
MCV: 94.5 fL (ref 80.0–100.0)
MONOS PCT: 8 %
Monocytes Absolute: 0.6 10*3/uL (ref 0.1–1.0)
Neutro Abs: 4.9 10*3/uL (ref 1.7–7.7)
Neutrophils Relative %: 64 %
Platelets: 222 10*3/uL (ref 150–400)
RBC: 3.97 MIL/uL (ref 3.87–5.11)
RDW: 12.5 % (ref 11.5–15.5)
WBC: 7.4 10*3/uL (ref 4.0–10.5)
nRBC: 0 % (ref 0.0–0.2)

## 2018-02-15 LAB — COMPREHENSIVE METABOLIC PANEL
ALT: 11 U/L (ref 0–44)
AST: 21 U/L (ref 15–41)
Albumin: 3.8 g/dL (ref 3.5–5.0)
Alkaline Phosphatase: 76 U/L (ref 38–126)
Anion gap: 10 (ref 5–15)
BUN: 13 mg/dL (ref 8–23)
CHLORIDE: 108 mmol/L (ref 98–111)
CO2: 25 mmol/L (ref 22–32)
CREATININE: 1.07 mg/dL — AB (ref 0.44–1.00)
Calcium: 9 mg/dL (ref 8.9–10.3)
GFR calc Af Amer: 55 mL/min — ABNORMAL LOW (ref >60)
GFR, EST NON AFRICAN AMERICAN: 48 mL/min — AB (ref >60)
Glucose, Bld: 99 mg/dL (ref 70–99)
Potassium: 3.3 mmol/L — ABNORMAL LOW (ref 3.5–5.1)
Sodium: 143 mmol/L (ref 135–145)
Total Bilirubin: 0.8 mg/dL (ref 0.3–1.2)
Total Protein: 6.8 g/dL (ref 6.5–8.1)

## 2018-02-15 LAB — PROTIME-INR
INR: 0.97
Prothrombin Time: 12.8 seconds (ref 11.4–15.2)

## 2018-02-15 LAB — MRSA PCR SCREENING: MRSA by PCR: NEGATIVE

## 2018-02-15 LAB — LIPASE, BLOOD: Lipase: 36 U/L (ref 11–51)

## 2018-02-15 LAB — BRAIN NATRIURETIC PEPTIDE: B Natriuretic Peptide: 103.1 pg/mL — ABNORMAL HIGH (ref 0.0–100.0)

## 2018-02-15 LAB — POCT I-STAT TROPONIN I: Troponin i, poc: 0.63 ng/mL (ref 0.00–0.08)

## 2018-02-15 LAB — POC OCCULT BLOOD, ED: FECAL OCCULT BLD: NEGATIVE

## 2018-02-15 LAB — APTT: aPTT: 30 seconds (ref 24–36)

## 2018-02-15 MED ORDER — ENOXAPARIN SODIUM 100 MG/ML ~~LOC~~ SOLN
1.0000 mg/kg | Freq: Two times a day (BID) | SUBCUTANEOUS | Status: DC
  Administered 2018-02-16 (×2): 95 mg via SUBCUTANEOUS
  Filled 2018-02-15 (×3): qty 0.95

## 2018-02-15 MED ORDER — HEPARIN (PORCINE) IN NACL 100-0.45 UNIT/ML-% IJ SOLN
1200.0000 [IU]/h | INTRAMUSCULAR | Status: DC
  Filled 2018-02-15: qty 250

## 2018-02-15 MED ORDER — ENOXAPARIN SODIUM 100 MG/ML ~~LOC~~ SOLN
1.0000 mg/kg | SUBCUTANEOUS | Status: AC
  Administered 2018-02-15: 95 mg via SUBCUTANEOUS
  Filled 2018-02-15: qty 0.95

## 2018-02-15 MED ORDER — SODIUM CHLORIDE 0.9 % IJ SOLN
INTRAMUSCULAR | Status: AC
  Filled 2018-02-15: qty 50

## 2018-02-15 MED ORDER — HEPARIN BOLUS VIA INFUSION
4000.0000 [IU] | INTRAVENOUS | Status: DC
  Filled 2018-02-15: qty 4000

## 2018-02-15 MED ORDER — IOPAMIDOL (ISOVUE-370) INJECTION 76%
INTRAVENOUS | Status: AC
  Filled 2018-02-15: qty 100

## 2018-02-15 MED ORDER — IOPAMIDOL (ISOVUE-370) INJECTION 76%
100.0000 mL | Freq: Once | INTRAVENOUS | Status: AC | PRN
  Administered 2018-02-15: 100 mL via INTRAVENOUS

## 2018-02-15 NOTE — ED Notes (Signed)
ED TO INPATIENT HANDOFF REPORT  Name/Age/Gender Jean King 82 y.o. female  Code Status   Home/SNF/Other Home  Chief Complaint Trouble breathing/Pain across back  Level of Care/Admitting Diagnosis ED Disposition    ED Disposition Condition [place]: [place] [[id number]]  Level of Care: Stepdown [14]  Admit to SDU based on following criteria: Severe physiological/psychological symptoms:  Any diagnosis requiring assessment & intervention at least every 4 hours on an ongoing basis to obtain desired patient outcomes including stability and rehabilitation  Diagnosis: Pulmonary emboli [facility]) [[id number]]  Admitting Physician: Bennie Pierini [[id number]]  Attending Physician: Jonnie Finner, [place] [[id number]]  Estimated length of stay: past midnight tomorrow  Certification:: I certify this patient will need inpatient services for at least 2 midnights  PT Class (Do Not Modify): Inpatient [101]  PT Acc Code (Do Not Modify): Private [1]       Medical History Past Medical History:  Diagnosis Date  . Cataract   . Depression   . GERD (gastroesophageal reflux disease)   . Thyroid disease     Allergies Allergies  Allergen Reactions  . Morphine And Related Hives  . Chlorhexidine Itching    Possible reaction to chlorhexidine - Dr. Nelva Bush used to cleanse site prior to cortisone shot (pt not sure if this is correct name of cleanser) 06/15/15  . Scopolamine Other (See Comments)    Jittery, hyperactivity    IV Location/Drains/Wounds Patient Lines/Drains/Airways Status   Active Line/Drains/Airways    Name:   Placement date:   Placement time:   Site:   Days:   Peripheral IV 02/15/18 Right Antecubital   02/15/18    1801    Antecubital   less than 1          Labs/Imaging Results for orders placed or performed during the hospital encounter of 02/15/18 (from the past 48 hour(s))  Comprehensive metabolic panel     Status: Abnormal   Collection  Time: 02/15/18  6:00 PM  Result Value Ref Range   Sodium 143 135 - 145 mmol/L   Potassium 3.3 (L) 3.5 - 5.1 mmol/L   Chloride 108 98 - 111 mmol/L   CO2 25 22 - 32 mmol/L   Glucose, Bld 99 70 - 99 mg/dL   BUN 13 8 - 23 mg/dL   Creatinine, Ser 1.07 (H) 0.44 - 1.00 mg/dL   Calcium 9.0 8.9 - 10.3 mg/dL   Total Protein 6.8 6.5 - 8.1 g/dL   Albumin 3.8 3.5 - 5.0 g/dL   AST 21 15 - 41 U/L   ALT 11 0 - 44 U/L   Alkaline Phosphatase 76 38 - 126 U/L   Total Bilirubin 0.8 0.3 - 1.2 mg/dL   GFR calc non Af Amer 48 (L) >60 mL/min   GFR calc Af Amer 55 (L) >60 mL/min    Comment: (NOTE) The eGFR has been calculated using the CKD EPI equation. This calculation has not been validated in all clinical situations. eGFR's persistently <60 mL/min signify possible Chronic Kidney Disease.    Anion gap 10 5 - 15    Comment: Performed at [facility], [place] [address] [place], [place] [address]  Lipase, blood     Status: None   Collection Time: 02/15/18  6:00 PM  Result Value Ref Range   Lipase 36 11 - 51 U/L    Comment: Performed at [facility], [place] [address], [place],  [address]  CBC with  Differential     Status: None   Collection Time: 02/15/18  6:00 PM  Result Value Ref Range   WBC 7.4 4.0 - 10.5 K/uL   RBC 3.97 3.87 - 5.11 MIL/uL   Hemoglobin 12.1 12.0 - 15.0 g/dL   HCT 37.5 36.0 - 46.0 %   MCV 94.5 80.0 - 100.0 fL   MCH 30.5 26.0 - 34.0 pg   MCHC 32.3 30.0 - 36.0 g/dL   RDW 12.5 11.5 - 15.5 %   Platelets 222 150 - 400 K/uL   nRBC 0.0 0.0 - 0.2 %   Neutrophils Relative % 64 %   Neutro Abs 4.9 1.7 - 7.7 K/uL   Lymphocytes Relative 22 %   Lymphs Abs 1.6 0.7 - 4.0 K/uL   Monocytes Relative 8 %   Monocytes Absolute 0.6 0.1 - 1.0 K/uL   Eosinophils Relative 4 %   Eosinophils Absolute 0.3 0.0 - 0.5 K/uL   Basophils Relative 1 %   Basophils Absolute 0.1 0.0 - 0.1 K/uL   Immature Granulocytes 1 %   Abs Immature Granulocytes 0.05 0.00 - 0.07 K/uL     Comment: Performed at [facility], [place] [address] [place], [place] [address]  Brain natriuretic peptide     Status: Abnormal   Collection Time: 02/15/18  6:00 PM  Result Value Ref Range   B Natriuretic Peptide 103.1 (H) 0.0 - 100.0 pg/mL    Comment: Performed at [facility], [place] [address] [place], [place] [address]  POCT i-Stat troponin I     Status: Abnormal   Collection Time: 02/15/18  6:08 PM  Result Value Ref Range   Troponin i, poc 0.63 (HH) 0.00 - 0.08 ng/mL   Comment NOTIFIED PHYSICIAN    Comment 3            Comment: Due to the release kinetics of cTnI, a negative result within the first hours of the onset of symptoms does not rule out myocardial infarction with certainty. If myocardial infarction is still suspected, repeat the test at appropriate intervals.   POC occult blood, ED Provider will collect     Status: None   Collection Time: 02/15/18  8:32 PM  Result Value Ref Range   Fecal Occult Bld NEGATIVE NEGATIVE   Dg Chest 2 View  Result Date: 02/15/2018 CLINICAL DATA:  82 year old female with shortness breath and mid back pain and left arm since last night. Initial encounter. EXAM: CHEST - 2 VIEW COMPARISON:  10/27/2017 chest x-ray. FINDINGS: Stable minimal peribronchial thickening. No infiltrate, congestive heart failure or pneumothorax. Heart size within normal limits. Calcified aorta. No acute osseous abnormality noted. IMPRESSION: 1. Chronic minimal peribronchial thickening. 2. No infiltrate or congestive heart failure. 3.  Aortic Atherosclerosis (ICD10-I70.0). Electronically Signed   By: Genia Del M.D.   On: 02/15/2018 18:12   Ct Angio Chest Pe W And/or Wo Contrast  Result Date: 02/15/2018 CLINICAL DATA:  Shortness of breath and chest pain EXAM: CT ANGIOGRAPHY CHEST WITH CONTRAST TECHNIQUE: Multidetector CT imaging of the chest was performed using the standard protocol during bolus administration of intravenous  contrast. Multiplanar CT image reconstructions and MIPs were obtained to evaluate the vascular anatomy. CONTRAST:  173m ISOVUE-370 IOPAMIDOL (ISOVUE-370) INJECTION 76% COMPARISON:  02/15/2018 radiograph, CT 06/15/2015 FINDINGS: Cardiovascular: Satisfactory opacification of the pulmonary arteries to the segmental level. Positive for bilateral acute pulmonary emboli with thrombus visualized in the distal right pulmonary artery and extending into right inter lobar pulmonary artery, and  multiple segmental and subsegmental right upper, right middle and right lower lobe arteries. Thrombus within the distal left pulmonary artery, extending into segmental and subsegmental left upper and lower lobe pulmonary arteries. Positive for right heart strain with RV LV ratio of 2.1. Moderate aortic atherosclerosis. Nonaneurysmal aorta. Heart size within normal limits. No pericardial effusion Mediastinum/Nodes: Midline trachea. No thyroid mass. No significant mediastinal adenopathy. Esophagus within normal limits. Lungs/Pleura: Lungs are clear. No pleural effusion or pneumothorax. Upper Abdomen: No acute abnormality. Musculoskeletal: No chest wall abnormality. No acute or significant osseous findings. Chronic superior endplate deformity at B14 Review of the MIP images confirms the above findings. IMPRESSION: Positive for acute bilateral right greater than left PE with CT evidence of right heart strain (RV/LV Ratio = 2.1) consistent with at least submassive (intermediate risk) PE. The presence of right heart strain has been associated with an increased risk of morbidity and mortality. Please activate Code PE by paging [phone]. Aortic Atherosclerosis (ICD10-I70.0). Critical Value/emergent results were called by telephone at the time of interpretation on 02/15/2018 at 7:49 pm to Dr. Margarita Mail, who verbally acknowledged these results. Electronically Signed   By: Donavan Foil M.D.   On: 02/15/2018 19:49   EKG  Interpretation  Date/Time:  Monday February 15 2018 16:11:43 EDT Ventricular Rate:  83 PR Interval:    QRS Duration: 90 QT Interval:  469 QTC Calculation: 552 R Axis:   44 Text Interpretation:  Sinus rhythm Borderline T abnormalities, anterior leads Prolonged QT interval no acute ischemic appearance, no sig change from old Confirmed by Charlesetta Shanks [phone]) on 02/15/2018 6:22:57 PM   Pending Labs Unresulted Labs (From admission, onward)    Start     Ordered   02/19/18 0500  CBC  Every 72 hours,   R     02/15/18 2044   02/15/18 2044  APTT  Once,   R     02/15/18 2044   02/15/18 2044  Protime-INR  Once,   R     02/15/18 2044   Signed and Held  Magnesium  Add-on,   R     Signed and Held   Signed and Held  Phosphorus  Add-on,   R     Signed and Held   Signed and Held  TSH  Add-on,   R     Signed and Held   Signed and Held  CBC  Tomorrow morning,   R     Signed and Held   Signed and Held  Basic metabolic panel  Tomorrow morning,   R     Signed and Held          Vitals/Pain Today's Vitals   02/15/18 1900 02/15/18 2000 02/15/18 2015 02/15/18 2030  BP: (!) 171/88 (!) 177/76 (!) 151/81 (!) 162/77  Pulse: 75  81 73  Resp: '18 15 12 13  '$ Temp:      TempSrc:      SpO2: 100% 99% 99% 98%  Weight:      Height:      PainSc:  0-No pain      Isolation Precautions No active isolations  Medications Medications  sodium chloride 0.9 % injection (has no administration in time range)  iopamidol (ISOVUE-370) 76 % injection (has no administration in time range)  enoxaparin (LOVENOX) injection 95 mg (has no administration in time range)  enoxaparin (LOVENOX) injection 95 mg (has no administration in time range)  iopamidol (ISOVUE-370) 76 % injection 100 mL (100 mLs Intravenous Contrast Given 02/15/18 1912)  Mobility walks

## 2018-02-15 NOTE — ED Notes (Signed)
Provider at bedside

## 2018-02-15 NOTE — H&P (Signed)
History and Physical    Jean King BPZ:[id number] DOB: [birth year] DOA: 02/15/2018  PCP: Josetta Huddle, MD  Patient coming from: Home  I have personally briefly reviewed patient's old medical records in [place]  Chief Complaint: Shortness of breath  HPI: Jean King is a 82 y.o. 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  Patient states that her discomfort has been progressing over that time and that she has been having difficulty sleeping due to her symptoms.  She also notes referral of her chest pain, which she describes as sharp, to the back.  She reports similar symptoms with her prior PE approximate 6 to 7 years ago.  No fever, no chills, no cough, no palpitations, no abdominal pain, no nausea, no vomiting, no diarrhea.  Patient reports family history of blood clots in her father and suspected clots in her brother, who passed away at 50 suddenly.  She denies recent surgery, recent travel.  She does admit to being largely sedentary at home, however, this has not changed in recent times.  ED Course: In the ED, patient afebrile, hemodynamically stable, mildly hypertensive and placed on 2 L submental oxygen for comfort.  Labs notable for BNP of 103 and troponin of 0.63.  EKG demonstrated sinus rhythm with anteroseptal T wave inversions.  Chest x-ray showed no acute findings.  CTA chest showed bilateral segmental PE with extensive clot burden in the subsegmental pulmonary arteries.  Patient started on Lovenox while in the ED.  Critical care was involved and recommended admission to stepdown given concern for right heart strain.  Review of Systems: As per HPI otherwise 10 point review of systems negative.   Past Medical History:  Diagnosis Date  . Cataract   . Depression    . GERD (gastroesophageal reflux disease)   . Thyroid disease     Past Surgical History:  Procedure Laterality Date  . ABDOMINAL HYSTERECTOMY    . JOINT REPLACEMENT    . SPINE SURGERY       reports that she has never smoked. She has never used smokeless tobacco. She reports that she does not drink alcohol or use drugs.  Allergies  Allergen Reactions  . Morphine And Related Hives  . Chlorhexidine Itching    Possible reaction to chlorhexidine - Dr. Nelva Bush used to cleanse site prior to cortisone shot (pt not sure if this is correct name of cleanser) 06/15/15  . Scopolamine Other (See Comments)    Jittery, hyperactivity    Family History  Problem Relation Age of Onset  . Cancer Mother   . Heart disease Father   . Stroke Father   . Cancer Sister   . Heart disease Brother     Prior to Admission medications   Medication Sig Start Date End Date Taking? Authorizing Provider  escitalopram (LEXAPRO) 20 MG tablet Take 10 mg by mouth See admin instructions. Take 1/2 tablet (10 mg) every other day for approx one more week (06/15/15), pt is tapering down and off this medication   Yes [provider]  fluticasone (FLONASE) 50 MCG/ACT nasal spray Place 2 sprays into both nostrils daily. 01/14/18  Yes [provider]  ibuprofen (ADVIL,MOTRIN) 200 MG tablet Take 400-600 mg by mouth 2 (two) times daily as needed for moderate pain.  Yes [provider]  levothyroxine (SYNTHROID, LEVOTHROID) 75 MCG tablet Take 75 mcg by mouth daily before breakfast. 05/24/15  Yes [provider]  Multiple Vitamin (MULTIVITAMIN WITH MINERALS) TABS tablet Take 1 tablet by mouth daily. Centrum Silver Women over 50   Yes [provider]  MYRBETRIQ 25 MG TB24 tablet Take 25 mg by mouth daily. 01/26/18  Yes [provider]  polyethylene glycol (MIRALAX / GLYCOLAX) packet Take 17 g by mouth daily.   Yes [provider]  simvastatin (ZOCOR) 40 MG tablet Take 40 mg  by mouth every evening. 11/24/17  Yes [provider]  traZODone (DESYREL) 100 MG tablet Take 50-100 mg by mouth at bedtime as needed for sleep. 12/25/17  Yes [provider]  cephALEXin (KEFLEX) 500 MG capsule Take 1 capsule (500 mg total) by mouth 3 (three) times daily. Patient not taking: Reported on 02/15/2018 09/28/16   Nehemiah Settle, NP    Physical Exam: Vitals:   02/15/18 1715 02/15/18 1815 02/15/18 1900 02/15/18 2000  BP: (!) 159/98 (!) 155/77 (!) 171/88 (!) 177/76  Pulse: 75 73 75   Resp: 16 (!) 22 18 15   Temp:      TempSrc:      SpO2: 95% 95% 100% 99%  Weight:      Height:        Constitutional: NAD, calm, comfortable Eyes: PERRL, lids and conjunctivae normal ENMT: Mucous membranes are moist. Posterior pharynx clear of any exudate or lesions. Neck: normal, supple, no masses Respiratory: clear to auscultation bilaterally, no wheezing, no crackles. Normal respiratory effort. Cardiovascular: Regular rate and rhythm, no murmurs / rubs / gallops. No extremity edema. 2+ pedal pulses. Abdomen: obese, no tenderness, no masses palpated. Bowel sounds positive.  Musculoskeletal: no clubbing / cyanosis. No joint deformity upper and lower extremities. Good ROM, no contractures. Normal muscle tone.  Skin: no rashes, lesions, ulcers. No induration Neurologic: CN 2-12 grossly intact. Sensation intact, DTR normal. Strength 5/5 in all 4.  Psychiatric: Normal judgment and insight. Alert and oriented x 3. Normal mood.   Labs on Admission: I have personally reviewed following labs and imaging studies  CBC: Recent Labs  Lab 02/15/18 1800  WBC 7.4  NEUTROABS 4.9  HGB 12.1  HCT 37.5  MCV 94.5  PLT 938   Basic Metabolic Panel: Recent Labs  Lab 02/15/18 1800  NA 143  K 3.3*  CL 108  CO2 25  GLUCOSE 99  BUN 13  CREATININE 1.07*  CALCIUM 9.0   GFR: Estimated Creatinine Clearance: 45.4 mL/min (A) (by C-G formula based on SCr of 1.07 mg/dL (H)). Liver  Function Tests: Recent Labs  Lab 02/15/18 1800  AST 21  ALT 11  ALKPHOS 76  BILITOT 0.8  PROT 6.8  ALBUMIN 3.8   Recent Labs  Lab 02/15/18 1800  LIPASE 36   No results for input(s): AMMONIA in the last 168 hours. Coagulation Profile: No results for input(s): INR, PROTIME in the last 168 hours. Cardiac Enzymes: No results for input(s): CKTOTAL, CKMB, CKMBINDEX, TROPONINI in the last 168 hours. BNP (last 3 results) No results for input(s): PROBNP in the last 8760 hours. HbA1C: No results for input(s): HGBA1C in the last 72 hours. CBG: No results for input(s): GLUCAP in the last 168 hours. Lipid Profile: No results for input(s): CHOL, HDL, LDLCALC, TRIG, CHOLHDL, LDLDIRECT in the last 72 hours. Thyroid Function Tests: No results for input(s): TSH, T4TOTAL, FREET4, T3FREE, THYROIDAB in the last 72 hours. Anemia Panel:  No results for input(s): VITAMINB12, FOLATE, FERRITIN, TIBC, IRON, RETICCTPCT in the last 72 hours. Urine analysis:    Component Value Date/Time   LABSPEC 1.015 09/28/2016 1613   PHURINE 6.5 09/28/2016 1613   GLUCOSEU NEGATIVE 09/28/2016 1613   HGBUR LARGE (A) 09/28/2016 1613   BILIRUBINUR NEGATIVE 09/28/2016 1613   BILIRUBINUR neg 12/13/2014 1740   KETONESUR NEGATIVE 09/28/2016 1613   PROTEINUR 30 (A) 09/28/2016 1613   UROBILINOGEN 0.2 09/28/2016 1613   NITRITE NEGATIVE 09/28/2016 1613   LEUKOCYTESUR LARGE (A) 09/28/2016 1613    Radiological Exams on Admission: Dg Chest 2 View  Result Date: 02/15/2018 CLINICAL DATA:  82 year old female with shortness breath and mid back pain and left arm since last night. Initial encounter. EXAM: CHEST - 2 VIEW COMPARISON:  10/27/2017 chest x-ray. FINDINGS: Stable minimal peribronchial thickening. No infiltrate, congestive heart failure or pneumothorax. Heart size within normal limits. Calcified aorta. No acute osseous abnormality noted. IMPRESSION: 1. Chronic minimal peribronchial thickening. 2. No infiltrate or  congestive heart failure. 3.  Aortic Atherosclerosis (ICD10-I70.0). Electronically Signed   By: Genia Del M.D.   On: 02/15/2018 18:12   Ct Angio Chest Pe W And/or Wo Contrast  Result Date: 02/15/2018 CLINICAL DATA:  Shortness of breath and chest pain EXAM: CT ANGIOGRAPHY CHEST WITH CONTRAST TECHNIQUE: Multidetector CT imaging of the chest was performed using the standard protocol during bolus administration of intravenous contrast. Multiplanar CT image reconstructions and MIPs were obtained to evaluate the vascular anatomy. CONTRAST:  155mL ISOVUE-370 IOPAMIDOL (ISOVUE-370) INJECTION 76% COMPARISON:  02/15/2018 radiograph, CT 06/15/2015 FINDINGS: Cardiovascular: Satisfactory opacification of the pulmonary arteries to the segmental level. Positive for bilateral acute pulmonary emboli with thrombus visualized in the distal right pulmonary artery and extending into right inter lobar pulmonary artery, and multiple segmental and subsegmental right upper, right middle and right lower lobe arteries. Thrombus within the distal left pulmonary artery, extending into segmental and subsegmental left upper and lower lobe pulmonary arteries. Positive for right heart strain with RV LV ratio of 2.1. Moderate aortic atherosclerosis. Nonaneurysmal aorta. Heart size within normal limits. No pericardial effusion Mediastinum/Nodes: Midline trachea. No thyroid mass. No significant mediastinal adenopathy. Esophagus within normal limits. Lungs/Pleura: Lungs are clear. No pleural effusion or pneumothorax. Upper Abdomen: No acute abnormality. Musculoskeletal: No chest wall abnormality. No acute or significant osseous findings. Chronic superior endplate deformity at J28 Review of the MIP images confirms the above findings. IMPRESSION: Positive for acute bilateral right greater than left PE with CT evidence of right heart strain (RV/LV Ratio = 2.1) consistent with at least submassive (intermediate risk) PE. The presence of right heart  strain has been associated with an increased risk of morbidity and mortality. Please activate Code PE by paging [phone]. Aortic Atherosclerosis (ICD10-I70.0). Critical Value/emergent results were called by telephone at the time of interpretation on 02/15/2018 at 7:49 pm to Dr. Margarita Mail, who verbally acknowledged these results. Electronically Signed   By: Donavan Foil M.D.   On: 02/15/2018 19:49    EKG: Independently reviewed. Sinus rhythm. Anteroseptal TWI.  Assessment/Plan Active Problems:   Pulmonary emboli ([place])  Ms. Killough is a 82 y.o. female with medical history noted above admitted for bilateral segmental PE with likely right heart strain. No clear provocation for PE. Perhaps has genotypic predisposition given prior history of PE 6-7 years ago. Little utility in sending thrombophilia panel as patient should be on lifelong anticoagulation with second DVT/PE. No personal history of malignancy; has aged out of all screening at this  point. No recent surgeries or travel. She is largely sedentary which likely contributed.  Acute bilateral segmental PE with right heart strain - Lovenox 1 mg/kg Q12H, appreciate Pharmacy assistance - Transition to [place] when appropriate for lifelong A/C - Obtain Duplex U/S of BLE - Admit to Stepdown per Critical Care request - Monitor on telemetry - Obtain TTE to evaluate right heart strain - Maintain SpO2 >/= 90%  Chronic medical conditions - Hypothyroidism: continue Synthroid - Depression/anxiety: continue Lexapro - Overactive bladder: continue Myrbetriq  DVT prophylaxis: Full-dose Lovenox Code Status: Full Family Communication: Daughter, Stanton Kidney Disposition Plan: Home in 2-3 days Consults called: ICU Admission status: Stepdown   Bennie Pierini MD [facility]  If 7PM-7AM, please contact night-coverage [email or web address] Password [facility]  02/15/2018, 8:38 PM

## 2018-02-15 NOTE — ED Triage Notes (Signed)
Patient c/o SOB and pain in the mid back and left arm since last night. Patient states she was lying flat during the night and had to sit up to breathe.

## 2018-02-15 NOTE — ED Triage Notes (Signed)
Per providers pt to go to ED for further eval

## 2018-02-15 NOTE — ED Notes (Signed)
Patient transported to CT 

## 2018-02-15 NOTE — ED Provider Notes (Signed)
[facility]-ICU/STEPDOWN Provider Note   CSN: [id number] Arrival date & time: 02/15/18  1557     History   Chief Complaint Chief Complaint  Patient presents with  . Shortness of Breath  . Back Pain  . Arm Pain    HPI Jean King is a 82 y.o. female with a past medical history of reflux, thyroid disease and previous history of unprovoked DVT and pulmonary embolus about 20 years ago with a family history of a brother who died of PE.  Patient states that yesterday she had sudden onset of shortness of breath with pain across her shoulder blades which is worse with taking deep breathing breaths.  She states that she was so uncomfortable she was unable to lie down on her bed and try to stay in her recliner.  She states that even there she had much difficulty getting to sleep because she felt extremely short of breath.  She denies significant chest pain she only complains of pain across her shoulder blades which is worse with deep breathing.  She becomes extremely winded when she talks for long periods of time or tries to ambulate.  She denies fever, hemoptysis.  She has chronic unilateral leg swelling in the left leg where she had a previous DVT but was told it was lymphedema.  She has no known history of heart disease.  She is never been a smoker.  She has no history of hypertension or high cholesterol. She denies any current neurologic abnormalities.  She denies nausea, diaphoresis.  She states that she did notice some black stools this past weekend which has resolved.  She denies abdominal pain. HPI  Past Medical History:  Diagnosis Date  . Cataract   . Depression   . GERD (gastroesophageal reflux disease)   . Thyroid disease     Patient Active Problem List   Diagnosis Date Noted  . Pulmonary emboli ([place]) 02/15/2018    Past Surgical History:  Procedure Laterality Date  . ABDOMINAL HYSTERECTOMY    . JOINT REPLACEMENT    . SPINE SURGERY       OB History     None      Home Medications    Prior to Admission medications   Medication Sig Start Date End Date Taking? Authorizing Provider  escitalopram (LEXAPRO) 20 MG tablet Take 10 mg by mouth See admin instructions. Take 1/2 tablet (10 mg) every other day for approx one more week (06/15/15), pt is tapering down and off this medication   Yes [provider]  fluticasone (FLONASE) 50 MCG/ACT nasal spray Place 2 sprays into both nostrils daily. 01/14/18  Yes [provider]  ibuprofen (ADVIL,MOTRIN) 200 MG tablet Take 400-600 mg by mouth 2 (two) times daily as needed for moderate pain.    Yes [provider]  levothyroxine (SYNTHROID, LEVOTHROID) 75 MCG tablet Take 75 mcg by mouth daily before breakfast. 05/24/15  Yes [provider]  Multiple Vitamin (MULTIVITAMIN WITH MINERALS) TABS tablet Take 1 tablet by mouth daily. Centrum Silver Women over 50   Yes [provider]  MYRBETRIQ 25 MG TB24 tablet Take 25 mg by mouth daily. 01/26/18  Yes [provider]  polyethylene glycol (MIRALAX / GLYCOLAX) packet Take 17 g by mouth daily.   Yes [provider]  simvastatin (ZOCOR) 40 MG tablet Take 40 mg by mouth every evening. 11/24/17  Yes [provider]  traZODone (DESYREL) 100 MG tablet Take 50-100 mg by mouth at bedtime as  needed for sleep. 12/25/17  Yes [provider]  cephALEXin (KEFLEX) 500 MG capsule Take 1 capsule (500 mg total) by mouth 3 (three) times daily. Patient not taking: Reported on 02/15/2018 09/28/16   Nehemiah Settle, NP    Family History Family History  Problem Relation Age of Onset  . Cancer Mother   . Heart disease Father   . Stroke Father   . Cancer Sister   . Heart disease Brother     Social History Social History   Tobacco Use  . Smoking status: Never Smoker  . Smokeless tobacco: Never Used  Substance Use Topics  . Alcohol use: No    Alcohol/week: 0.0 standard drinks  . Drug use: Never      Allergies   Morphine and related; Chlorhexidine; and Scopolamine   Review of Systems Review of Systems Ten systems reviewed and are negative for acute change, except as noted in the HPI.    Physical Exam Updated Vital Signs BP (!) 153/82   Pulse 72   Temp 97.9 F (36.6 C) (Oral)   Resp 20   Ht 5\' 3"  (1.6 m)   Wt 95.8 kg   SpO2 96%   BMI 37.41 kg/m   Physical Exam  Constitutional: She is oriented to person, place, and time. She appears well-developed and well-nourished. No distress.  HENT:  Head: Normocephalic and atraumatic.  Mouth/Throat: Oropharynx is clear and moist.  Eyes: Pupils are equal, round, and reactive to light. Conjunctivae and EOM are normal. No scleral icterus.  No horizontal, vertical or rotational nystagmus  Neck: Normal range of motion. Neck supple.  Full active and passive ROM without pain No midline or paraspinal tenderness No nuchal rigidity or meningeal signs  Cardiovascular: Normal rate, regular rhythm and intact distal pulses. Exam reveals no gallop.  No murmur heard. Pulmonary/Chest: Effort normal and breath sounds normal. No respiratory distress. She has no decreased breath sounds. She has no wheezes. She has no rhonchi. She has no rales.  Abdominal: Soft. Bowel sounds are normal. She exhibits no distension. There is no tenderness. There is no rebound and no guarding.  Musculoskeletal: Normal range of motion.  Lymphadenopathy:    She has no cervical adenopathy.  Neurological: She is alert and oriented to person, place, and time. She has normal reflexes. No cranial nerve deficit. She exhibits normal muscle tone. Coordination normal.  Mental Status:  Alert, oriented, thought content appropriate. Speech fluent without evidence of aphasia. Able to follow 2 step commands without difficulty.  Cranial Nerves:  II:  Peripheral visual fields grossly normal, pupils equal, round, reactive to light III,IV, VI: ptosis not present, extra-ocular motions  intact bilaterally  V,VII: smile symmetric, facial light touch sensation equal VIII: hearing grossly normal bilaterally  IX,X: midline uvula rise  XI: bilateral shoulder shrug equal and strong XII: midline tongue extension  Motor:  5/5 in upper and lower extremities bilaterally including strong and equal grip strength and dorsiflexion/plantar flexion Sensory: Pinprick and light touch normal in all extremities.  Deep Tendon Reflexes: 2+ and symmetric  Cerebellar: normal finger-to-nose with bilateral upper extremities Gait: normal gait and balance CV: distal pulses palpable throughout   Skin: Skin is warm and dry. No rash noted. She is not diaphoretic.  Psychiatric: She has a normal mood and affect. Her behavior is normal. Judgment and thought content normal.  Nursing note and vitals reviewed.    ED Treatments / Results  Labs (all labs ordered are listed, but only abnormal results are displayed)  Labs Reviewed  COMPREHENSIVE METABOLIC PANEL - Abnormal; Notable for the following components:      Result Value   Potassium 3.3 (*)    Creatinine, Ser 1.07 (*)    GFR calc non Af Amer 48 (*)    GFR calc Af Amer 55 (*)    All other components within normal limits  BRAIN NATRIURETIC PEPTIDE - Abnormal; Notable for the following components:   B Natriuretic Peptide 103.1 (*)    All other components within normal limits  POCT I-STAT TROPONIN I - Abnormal; Notable for the following components:   Troponin i, poc 0.63 (*)    All other components within normal limits  MRSA PCR SCREENING  LIPASE, BLOOD  CBC WITH DIFFERENTIAL/PLATELET  APTT  PROTIME-INR  I-STAT TROPONIN, ED  POC OCCULT BLOOD, ED    EKG EKG Interpretation  Date/Time:  Monday February 15 2018 16:11:43 EDT Ventricular Rate:  83 PR Interval:    QRS Duration: 90 QT Interval:  469 QTC Calculation: 552 R Axis:   44 Text Interpretation:  Sinus rhythm Borderline T abnormalities, anterior leads Prolonged QT interval no acute  ischemic appearance, no sig change from old Confirmed by Charlesetta Shanks [phone]) on 02/15/2018 6:22:57 PM   Radiology Dg Chest 2 View  Result Date: 02/15/2018 CLINICAL DATA:  82 year old female with shortness breath and mid back pain and left arm since last night. Initial encounter. EXAM: CHEST - 2 VIEW COMPARISON:  10/27/2017 chest x-ray. FINDINGS: Stable minimal peribronchial thickening. No infiltrate, congestive heart failure or pneumothorax. Heart size within normal limits. Calcified aorta. No acute osseous abnormality noted. IMPRESSION: 1. Chronic minimal peribronchial thickening. 2. No infiltrate or congestive heart failure. 3.  Aortic Atherosclerosis (ICD10-I70.0). Electronically Signed   By: Genia Del M.D.   On: 02/15/2018 18:12   Ct Angio Chest Pe W And/or Wo Contrast  Result Date: 02/15/2018 CLINICAL DATA:  Shortness of breath and chest pain EXAM: CT ANGIOGRAPHY CHEST WITH CONTRAST TECHNIQUE: Multidetector CT imaging of the chest was performed using the standard protocol during bolus administration of intravenous contrast. Multiplanar CT image reconstructions and MIPs were obtained to evaluate the vascular anatomy. CONTRAST:  114mL ISOVUE-370 IOPAMIDOL (ISOVUE-370) INJECTION 76% COMPARISON:  02/15/2018 radiograph, CT 06/15/2015 FINDINGS: Cardiovascular: Satisfactory opacification of the pulmonary arteries to the segmental level. Positive for bilateral acute pulmonary emboli with thrombus visualized in the distal right pulmonary artery and extending into right inter lobar pulmonary artery, and multiple segmental and subsegmental right upper, right middle and right lower lobe arteries. Thrombus within the distal left pulmonary artery, extending into segmental and subsegmental left upper and lower lobe pulmonary arteries. Positive for right heart strain with RV LV ratio of 2.1. Moderate aortic atherosclerosis. Nonaneurysmal aorta. Heart size within normal limits. No pericardial effusion  Mediastinum/Nodes: Midline trachea. No thyroid mass. No significant mediastinal adenopathy. Esophagus within normal limits. Lungs/Pleura: Lungs are clear. No pleural effusion or pneumothorax. Upper Abdomen: No acute abnormality. Musculoskeletal: No chest wall abnormality. No acute or significant osseous findings. Chronic superior endplate deformity at N98 Review of the MIP images confirms the above findings. IMPRESSION: Positive for acute bilateral right greater than left PE with CT evidence of right heart strain (RV/LV Ratio = 2.1) consistent with at least submassive (intermediate risk) PE. The presence of right heart strain has been associated with an increased risk of morbidity and mortality. Please activate Code PE by paging [phone]. Aortic Atherosclerosis (ICD10-I70.0). Critical Value/emergent results were called by telephone at the time of interpretation on 02/15/2018 at  7:49 pm to Dr. Margarita Mail, who verbally acknowledged these results. Electronically Signed   By: Donavan Foil M.D.   On: 02/15/2018 19:49    Procedures Procedures (including critical care time)  Medications Ordered in ED Medications  sodium chloride 0.9 % injection (has no administration in time range)  iopamidol (ISOVUE-370) 76 % injection (has no administration in time range)  enoxaparin (LOVENOX) injection 95 mg (has no administration in time range)  iopamidol (ISOVUE-370) 76 % injection 100 mL (100 mLs Intravenous Contrast Given 02/15/18 1912)  enoxaparin (LOVENOX) injection 95 mg (95 mg Subcutaneous Given 02/15/18 2131)     Initial Impression / Assessment and Plan / ED Course  I have reviewed the triage vital signs and the nursing notes.  Pertinent labs & imaging results that were available during my care of the patient were reviewed by me and considered in my medical decision making (see chart for details).  Clinical Course as of Feb 16 2247  Mon Feb 15, 2018  2247 Troponin i, poc(!!): 0.63 [AH]      Clinical Course User Index [AH] Margarita Mail, PA-C    82 year old female with complaint of shortness of breath.The emergent differential diagnosis for shortness of breath includes, but is not limited to, Pulmonary edema, bronchoconstriction, Pneumonia, Pulmonary embolism, Pneumotherax/ Hemothorax, Dysrythmia, ACS.  This patient's history of previous unprovoked pulmonary embolus I have strong concern for such.  I obtained a CT angios of the chest which showed bilateral pulmonary embolus with right heart strain.  I discussed the case with Dr. Emmit Alexanders of pulmonary and critical care who states that she is not a candidate for catheter directed lysis.  I spoke with Dr. Burnett Sheng who will admit the patient to the stepdown unit.  She was started on a heparin bolus and drip for her pulmonary emboli.  I did a digital rectal exam which showed no melena on examining finger.  Her point-of-care stool card is currently pending.  Patient does not have tachycardia.  She has had oxygen saturations above 90% on room air.  Final Clinical Impressions(s) / ED Diagnoses   Final diagnoses:  Other acute pulmonary embolism with acute cor pulmonale [facility])    ED Discharge Orders    None       Margarita Mail, PA-C 02/15/18 2254    Margette Fast, MD 02/16/18 [phone]

## 2018-02-15 NOTE — ED Notes (Signed)
Report given to Sarah RN

## 2018-02-15 NOTE — Consult Note (Signed)
NAME:  EMMERY SEILER, MRN:  [id number], DOB:  1935/06/26, LOS: 0 ADMISSION DATE:  02/15/2018, CONSULTATION DATE:  02/15/2018 REFERRING MD:  Dr. Laverta [place], ER, CHIEF COMPLAINT:  Short of breath   Brief History   82 yo female presented to ER with dyspnea and back pain x 48 hrs.  Found to have b/l PE with RV:LV ratio 2.1.  She has hx of PE and Lt posterior tibial DVT in August 2013.  Past Medical History  Depression, GERD, Hypothyroidism, HLD, Trigeminal neuralgia, Hearing loss  Significant Hospital Events   10/14 Admit  Consults: date of consult/date signed off & final recs:    Procedures (surgical and bedside):    Significant Diagnostic Tests:  CT angio chest 10/14 >> b/l acute PE distal Rt PA and distal Lt PA, RV:LV ratio 2.1 (reviewed by me)  Micro Data:    Antimicrobials:     Subjective:  Breathing better after getting supplemental oxygen.  Objective   Blood pressure (!) 177/76, pulse 75, temperature 98.2 F (36.8 C), temperature source Oral, resp. rate 15, height 5\' 3"  (1.6 m), weight 93 kg, SpO2 99 %.       No intake or output data in the 24 hours ending 02/15/18 2029 Filed Weights   02/15/18 1602  Weight: 93 kg    Examination:  General - alert Eyes - pupils reactive ENT - no sinus tenderness, no stridor Cardiac - regular rate/rhythm, no murmur Chest - equal breath sounds b/l, no wheezing or rales Abdomen - soft, non tender, + bowel sounds, no hepatosplenomegaly Extremities - no cyanosis, clubbing, or edema Skin - no rashes Lymphatics - no lymphadenopathy Neuro - CN intact, normal strength, moves extremities, follows commands Psych - normal mood and behavior   Assessment & Plan:   Acute submassive pulmonary embolism with RV strain by CT criteria. - hemodynamics and oxygenation stable - risk outweighs benefit at this time for thrombolytic therapy (either catheter directed or systemic) - agree with plan to start heparin  - f/u doppler legs, Echo - if no  other test findings of significance, then can likely transition to oral anticoagulant soon - explained that given recurrence of thromboembolic disease w/o clear cause, she will need to be on anticoagulation indefinitely  Disposition / Summary of Today's Plan 02/15/18   Admit to hospital per [facility].  PCCM will f/u as needed if she has significant findings on Echo or doppler of legs that would indicate need for catheter directed thrombolytics or consideration for temporary IVC filter placement.     Code Status: Full code Family Communication: Updated family at bedside  Labs   CBC: Recent Labs  Lab 02/15/18 1800  WBC 7.4  NEUTROABS 4.9  HGB 12.1  HCT 37.5  MCV 94.5  PLT 481    Basic Metabolic Panel: Recent Labs  Lab 02/15/18 1800  NA 143  K 3.3*  CL 108  CO2 25  GLUCOSE 99  BUN 13  CREATININE 1.07*  CALCIUM 9.0   GFR: Estimated Creatinine Clearance: 45.4 mL/min (A) (by C-G formula based on SCr of 1.07 mg/dL (H)). Recent Labs  Lab 02/15/18 1800  WBC 7.4    Liver Function Tests: Recent Labs  Lab 02/15/18 1800  AST 21  ALT 11  ALKPHOS 76  BILITOT 0.8  PROT 6.8  ALBUMIN 3.8   Recent Labs  Lab 02/15/18 1800  LIPASE 36   No results for input(s): AMMONIA in the last 168 hours.  ABG No results found for: PHART, PCO2ART,  PO2ART, HCO3, TCO2, ACIDBASEDEF, O2SAT   Coagulation Profile: No results for input(s): INR, PROTIME in the last 168 hours.  Cardiac Enzymes: No results for input(s): CKTOTAL, CKMB, CKMBINDEX, TROPONINI in the last 168 hours.  HbA1C: No results found for: HGBA1C  CBG: No results for input(s): GLUCAP in the last 168 hours.  Admitting History of Present Illness.   82 yo female developed a GI bug about 2 weeks prior to admission.  She was recovering from this.  Then 48 hrs prior to admission she developed sudden onset of shortness of breath.  This was associated with pains across her chest and back.  She presented to ER.  CT chest showed  b/l PE with RV strain.    She denies chest pain at present.  Breathing better after applying oxygen.  Denies headache, nausea, abdominal pain.  Has not had any recent travel or surgeries.  Has not been started on any new medications recently.  No hx of stroke, intracranial bleeding, brain surgery, GI bleeding, or cancer.  She had Lt leg DVT with PE in August 2013.  She was treated with warfarin for 6 months, and has been off anticoagulation since.  There apparently was not clear provoking factor for her thromboembolic event in 4403.  She reports that her father had a blood clot.  Review of Systems:   Right foot weakness after previous surgery.  Otherwise negative.  Past Medical History  She,  has a past medical history of Cataract, Depression, GERD (gastroesophageal reflux disease), and Thyroid disease.   Surgical History    Past Surgical History:  Procedure Laterality Date  . ABDOMINAL HYSTERECTOMY    . JOINT REPLACEMENT    . SPINE SURGERY       Social History   Social History   Socioeconomic History  . Marital status: Widowed    Spouse name: Not on file  . Number of children: Not on file  . Years of education: Not on file  . Highest education level: Not on file  Occupational History  . Not on file  Social Needs  . Financial resource strain: Not on file  . Food insecurity:    Worry: Not on file    Inability: Not on file  . Transportation needs:    Medical: Not on file    Non-medical: Not on file  Tobacco Use  . Smoking status: Never Smoker  . Smokeless tobacco: Never Used  Substance and Sexual Activity  . Alcohol use: No    Alcohol/week: 0.0 standard drinks  . Drug use: Never  . Sexual activity: Not on file  Lifestyle  . Physical activity:    Days per week: Not on file    Minutes per session: Not on file  . Stress: Not on file  Relationships  . Social connections:    Talks on phone: Not on file    Gets together: Not on file    Attends religious service: Not  on file    Active member of club or organization: Not on file    Attends meetings of clubs or organizations: Not on file    Relationship status: Not on file  . Intimate partner violence:    Fear of current or ex partner: Not on file    Emotionally abused: Not on file    Physically abused: Not on file    Forced sexual activity: Not on file  Other Topics Concern  . Not on file  Social History Narrative  . Not on file  ,  reports that she has never smoked. She has never used smokeless tobacco. She reports that she does not drink alcohol or use drugs.   Family History   Her family history includes Cancer in her mother and sister; Heart disease in her brother and father; Stroke in her father.   Allergies Allergies  Allergen Reactions  . Morphine And Related Hives  . Chlorhexidine Itching    Possible reaction to chlorhexidine - Dr. Nelva Bush used to cleanse site prior to cortisone shot (pt not sure if this is correct name of cleanser) 06/15/15  . Scopolamine Other (See Comments)    Jittery, hyperactivity     Home Medications  Prior to Admission medications   Medication Sig Start Date End Date Taking? Authorizing Provider  escitalopram (LEXAPRO) 20 MG tablet Take 10 mg by mouth See admin instructions. Take 1/2 tablet (10 mg) every other day for approx one more week (06/15/15), pt is tapering down and off this medication   Yes [provider]  fluticasone (FLONASE) 50 MCG/ACT nasal spray Place 2 sprays into both nostrils daily. 01/14/18  Yes [provider]  ibuprofen (ADVIL,MOTRIN) 200 MG tablet Take 400-600 mg by mouth 2 (two) times daily as needed for moderate pain.    Yes [provider]  levothyroxine (SYNTHROID, LEVOTHROID) 75 MCG tablet Take 75 mcg by mouth daily before breakfast. 05/24/15  Yes [provider]  Multiple Vitamin (MULTIVITAMIN WITH MINERALS) TABS tablet Take 1 tablet by mouth daily. Centrum Silver Women over 50   Yes [provider]  MYRBETRIQ 25 MG TB24 tablet Take 25 mg by mouth daily. 01/26/18  Yes [provider]  polyethylene glycol (MIRALAX / GLYCOLAX) packet Take 17 g by mouth daily.   Yes [provider]  simvastatin (ZOCOR) 40 MG tablet Take 40 mg by mouth every evening. 11/24/17  Yes [provider]  traZODone (DESYREL) 100 MG tablet Take 50-100 mg by mouth at bedtime as needed for sleep. 12/25/17  Yes [provider]  cephALEXin (KEFLEX) 500 MG capsule Take 1 capsule (500 mg total) by mouth 3 (three) times daily. Patient not taking: Reported on 02/15/2018 09/28/16   Nehemiah Settle, NP    Chesley Mires, MD [place] 02/15/2018, 9:03 PM

## 2018-02-15 NOTE — Progress Notes (Signed)
ANTICOAGULATION CONSULT NOTE - Initial Consult  Pharmacy Consult for Enoxaparin  Indication: pulmonary embolus  Allergies  Allergen Reactions  . Morphine And Related Hives  . Chlorhexidine Itching    Possible reaction to chlorhexidine - Dr. Nelva Bush used to cleanse site prior to cortisone shot (pt not sure if this is correct name of cleanser) 06/15/15  . Scopolamine Other (See Comments)    Jittery, hyperactivity    Patient Measurements: Height: 5\' 3"  (160 cm) Weight: 205 lb (93 kg) IBW/kg (Calculated) : 52.4   Vital Signs: Temp: 98.2 F (36.8 C) (10/14 1604) Temp Source: Oral (10/14 1604) BP: 177/76 (10/14 2000) Pulse Rate: 75 (10/14 1900)  Labs: Recent Labs    02/15/18 1800  HGB 12.1  HCT 37.5  PLT 222  CREATININE 1.07*    Estimated Creatinine Clearance: 45.4 mL/min (A) (by C-G formula based on SCr of 1.07 mg/dL (H)).   Medical History: Past Medical History:  Diagnosis Date  . Cataract   . Depression   . GERD (gastroesophageal reflux disease)   . Thyroid disease     Assessment: 54 y/oF who [facility] ED on 02/15/18 with dyspnea and back pain x 24 hours. Patient has PMH of PE and DVT in August 2013. CTa of chest + for acute bilateral PE with evidence of right heart strain. Pharmacy consulted for Enoxaparin dosing per admitting provider. CBC WNL. Patient not on any anticoagulants PTA. SCr 1.07 with CrCl ~ 45 ml/min.   Goal of Therapy:  Anti-Xa level 0.6-1 units/ml 4hrs after LMWH dose given Monitor platelets by anticoagulation protocol: Yes   Plan:   Enoxaparin 1 mg/kg SQ q12h  CBC at least q72 hours while inpatient  Monitor closely for s/sx of bleeding   Lindell Spar, PharmD, BCPS Pager: [phone] 02/15/2018 8:22 PM

## 2018-02-15 NOTE — ED Notes (Signed)
Bed: WA24 Expected date:  Expected time:  Means of arrival:  Comments: EMS 

## 2018-02-16 ENCOUNTER — Inpatient Hospital Stay (HOSPITAL_COMMUNITY): Payer: Medicare Other

## 2018-02-16 ENCOUNTER — Encounter (HOSPITAL_COMMUNITY): Payer: Self-pay

## 2018-02-16 DIAGNOSIS — I361 Nonrheumatic tricuspid (valve) insufficiency: Secondary | ICD-10-CM

## 2018-02-16 DIAGNOSIS — I2699 Other pulmonary embolism without acute cor pulmonale: Secondary | ICD-10-CM

## 2018-02-16 LAB — CBC
HEMATOCRIT: 34.8 % — AB (ref 36.0–46.0)
HEMOGLOBIN: 11.5 g/dL — AB (ref 12.0–15.0)
MCH: 30.8 pg (ref 26.0–34.0)
MCHC: 33 g/dL (ref 30.0–36.0)
MCV: 93.3 fL (ref 80.0–100.0)
NRBC: 0 % (ref 0.0–0.2)
Platelets: 197 10*3/uL (ref 150–400)
RBC: 3.73 MIL/uL — AB (ref 3.87–5.11)
RDW: 12.5 % (ref 11.5–15.5)
WBC: 8.1 10*3/uL (ref 4.0–10.5)

## 2018-02-16 LAB — BASIC METABOLIC PANEL
ANION GAP: 10 (ref 5–15)
BUN: 12 mg/dL (ref 8–23)
CO2: 24 mmol/L (ref 22–32)
Calcium: 9 mg/dL (ref 8.9–10.3)
Chloride: 110 mmol/L (ref 98–111)
Creatinine, Ser: 0.97 mg/dL (ref 0.44–1.00)
GFR calc non Af Amer: 54 mL/min — ABNORMAL LOW (ref >60)
Glucose, Bld: 111 mg/dL — ABNORMAL HIGH (ref 70–99)
POTASSIUM: 3.6 mmol/L (ref 3.5–5.1)
Sodium: 144 mmol/L (ref 135–145)

## 2018-02-16 LAB — TSH: TSH: 3.805 u[IU]/mL (ref 0.350–4.500)

## 2018-02-16 LAB — MAGNESIUM: Magnesium: 1.8 mg/dL (ref 1.7–2.4)

## 2018-02-16 LAB — GLUCOSE, CAPILLARY: Glucose-Capillary: 104 mg/dL — ABNORMAL HIGH (ref 70–99)

## 2018-02-16 LAB — PHOSPHORUS: Phosphorus: 3.3 mg/dL (ref 2.5–4.6)

## 2018-02-16 MED ORDER — ACETAMINOPHEN 650 MG RE SUPP: 650.0000 mg | Freq: Four times a day (QID) | RECTAL | Status: DC | PRN

## 2018-02-16 MED ORDER — MIRABEGRON ER 25 MG PO TB24
25.0000 mg | ORAL_TABLET | Freq: Every day | ORAL | Status: DC
  Administered 2018-02-16 – 2018-02-18 (×3): 25 mg via ORAL
  Filled 2018-02-16 (×3): qty 1

## 2018-02-16 MED ORDER — LEVOTHYROXINE SODIUM 50 MCG PO TABS
75.0000 ug | ORAL_TABLET | Freq: Every day | ORAL | Status: DC
  Administered 2018-02-16 – 2018-02-18 (×3): 75 ug via ORAL
  Filled 2018-02-16 (×3): qty 1

## 2018-02-16 MED ORDER — ONDANSETRON HCL 4 MG PO TABS: 4.0000 mg | ORAL_TABLET | Freq: Four times a day (QID) | ORAL | Status: DC | PRN

## 2018-02-16 MED ORDER — DOCUSATE SODIUM 100 MG PO CAPS
100.0000 mg | ORAL_CAPSULE | Freq: Two times a day (BID) | ORAL | Status: DC
  Administered 2018-02-16 – 2018-02-18 (×2): 100 mg via ORAL
  Filled 2018-02-16 (×3): qty 1

## 2018-02-16 MED ORDER — SIMVASTATIN 40 MG PO TABS
40.0000 mg | ORAL_TABLET | Freq: Every evening | ORAL | Status: DC
  Administered 2018-02-16 – 2018-02-17 (×2): 40 mg via ORAL
  Filled 2018-02-16 (×2): qty 1

## 2018-02-16 MED ORDER — SODIUM CHLORIDE 0.9% FLUSH
3.0000 mL | Freq: Two times a day (BID) | INTRAVENOUS | Status: DC
  Administered 2018-02-16 – 2018-02-18 (×5): 3 mL via INTRAVENOUS

## 2018-02-16 MED ORDER — ACETAMINOPHEN 325 MG PO TABS
650.0000 mg | ORAL_TABLET | Freq: Four times a day (QID) | ORAL | Status: DC | PRN
  Administered 2018-02-16 – 2018-02-17 (×5): 650 mg via ORAL
  Filled 2018-02-16 (×5): qty 2

## 2018-02-16 MED ORDER — ONDANSETRON HCL 4 MG/2ML IJ SOLN: 4.0000 mg | Freq: Four times a day (QID) | INTRAMUSCULAR | Status: DC | PRN

## 2018-02-16 MED ORDER — SENNA 8.6 MG PO TABS
1.0000 | ORAL_TABLET | Freq: Two times a day (BID) | ORAL | Status: DC
  Administered 2018-02-16: 8.6 mg via ORAL
  Filled 2018-02-16 (×3): qty 1

## 2018-02-16 MED ORDER — ESCITALOPRAM OXALATE 20 MG PO TABS
20.0000 mg | ORAL_TABLET | Freq: Every day | ORAL | Status: DC
  Administered 2018-02-16 – 2018-02-18 (×3): 20 mg via ORAL
  Filled 2018-02-16 (×3): qty 1

## 2018-02-16 NOTE — Progress Notes (Signed)
*  Preliminary Results* Bilateral lower extremity venous duplex completed. Bilateral lower extremities are negative for deep vein thrombosis. There is no evidence of Baker's cyst bilaterally.  02/16/2018 3:22 PM Maudry Mayhew, MHA, RVT, RDCS, RDMS

## 2018-02-16 NOTE — Progress Notes (Signed)
NAME:  Jean King, MRN:  [id number], DOB:  02-05-1936, LOS: 1 ADMISSION DATE:  02/15/2018, CONSULTATION DATE: 02/15/2018 REFERRING MD:  Karleen Hampshire, CHIEF COMPLAINT: Shortness of breath  Brief History   82 year old lady, came into the ED with shortness of breath and back pain for 48 hours Consult bilateral PE She does have a history of prior PE tibial DVT in August 2013  Past Medical History  Depression, GERD, hypothyroidism, HLD, trigeminal neuralgia, hearing loss Significant Hospital Events   Admitted on 1014 Has been stable  Consults: date of consult/date signed off & final recs:    Procedures (surgical and bedside):    Significant Diagnostic Tests:  CT angios 1014 showing bilateral pulmonary embolism, right ventricle: Left ventricle ratio 2:1  Micro Data:    Antimicrobials:     Subjective:  She is breathing better, not tachycardic, not tachypneic  Objective   Blood pressure 123/66, pulse 66, temperature 98 F (36.7 C), temperature source Oral, resp. rate 14, height 5\' 3"  (1.6 m), weight 95.8 kg, SpO2 95 %.       No intake or output data in the 24 hours ending 02/16/18 1623 Filed Weights   02/15/18 1602 02/15/18 2127 02/16/18 0213  Weight: 93 kg 95.8 kg 95.8 kg    Examination: General: Elderly lady, does appear comfortable HENT: Moist oral mucosa Lungs: Good entry bilaterally Cardiovascular: S1-S2 appreciated Abdomen: Bowel sounds appreciated Extremities: No edema Neuro: Alert and oriented x3, moving all extremities GU: Good urine output  Resolved Hospital Problem list     Assessment & Plan:  Acute submassive pulmonary embolism with right ventricular strain on CT scan -She remains hemodynamically stable, she only has oxygen supplementation on because it makes her feel better, saturations in the high 90s -She is not tachycardic -She is not tachypneic -Chest pain is resolved  Echocardiogram pending Ultrasound of the lower extremity pending  She has  had 2 more embolic events in the past Lifelong anticoagulation recommended  Disposition / Summary of Today's Plan 02/16/18    If no DVT-does not need a filter Severe right heart strain-may be considered for catheter directed thrombolysis however with patient continuing to remain hemodynamically stable with no sign of significant heart failure-risk at present continues to outweigh benefits     Mobility:-No strenuous activities, may ambulate Code Status: Full code  family Communication: Updated family at bedside  Labs   CBC: Recent Labs  Lab 02/15/18 1800 02/16/18 0256  WBC 7.4 8.1  NEUTROABS 4.9  --   HGB 12.1 11.5*  HCT 37.5 34.8*  MCV 94.5 93.3  PLT 222 616    Basic Metabolic Panel: Recent Labs  Lab 02/15/18 1800 02/16/18 0256  NA 143 144  K 3.3* 3.6  CL 108 110  CO2 25 24  GLUCOSE 99 111*  BUN 13 12  CREATININE 1.07* 0.97  CALCIUM 9.0 9.0  MG  --  1.8  PHOS  --  3.3   GFR: Estimated Creatinine Clearance: 51 mL/min (by C-G formula based on SCr of 0.97 mg/dL). Recent Labs  Lab 02/15/18 1800 02/16/18 0256  WBC 7.4 8.1    Liver Function Tests: Recent Labs  Lab 02/15/18 1800  AST 21  ALT 11  ALKPHOS 76  BILITOT 0.8  PROT 6.8  ALBUMIN 3.8   Recent Labs  Lab 02/15/18 1800  LIPASE 36   No results for input(s): AMMONIA in the last 168 hours.  ABG No results found for: PHART, PCO2ART, PO2ART, HCO3, TCO2, ACIDBASEDEF, O2SAT  Coagulation Profile: Recent Labs  Lab 02/15/18 2055  INR 0.97    Cardiac Enzymes: No results for input(s): CKTOTAL, CKMB, CKMBINDEX, TROPONINI in the last 168 hours.  HbA1C: No results found for: HGBA1C  CBG: Recent Labs  Lab 02/16/18 0740  GLUCAP 104*    Admitting History of Present Illness.    She developed shortness of breath, easy fatigability about 48 hours prior to coming to the hospital Had pain across the back Denies any recent travel No recent surgery No new medications recently No history of  CVA No history of GI bleeding No history of cancer  DVT in August 2013-was treated with warfarin for 6 months  Review of Systems:   Review of Systems  Constitutional: Negative.  Negative for fever.  Eyes: Negative.   Respiratory: Positive for shortness of breath. Negative for cough.   Cardiovascular: Positive for chest pain. Negative for leg swelling.  Gastrointestinal: Negative.   Genitourinary: Negative.   Musculoskeletal: Negative.   All other systems reviewed and are negative.    Past Medical History  She,  has a past medical history of Cataract, Depression, GERD (gastroesophageal reflux disease), and Thyroid disease.   Surgical History    Past Surgical History:  Procedure Laterality Date  . ABDOMINAL HYSTERECTOMY    . JOINT REPLACEMENT    . SPINE SURGERY       Social History   Social History   Socioeconomic History  . Marital status: Widowed    Spouse name: Not on file  . Number of children: Not on file  . Years of education: Not on file  . Highest education level: Not on file  Occupational History  . Not on file  Social Needs  . Financial resource strain: Not on file  . Food insecurity:    Worry: Not on file    Inability: Not on file  . Transportation needs:    Medical: Not on file    Non-medical: Not on file  Tobacco Use  . Smoking status: Never Smoker  . Smokeless tobacco: Never Used  Substance and Sexual Activity  . Alcohol use: No    Alcohol/week: 0.0 standard drinks  . Drug use: Never  . Sexual activity: Not on file  Lifestyle  . Physical activity:    Days per week: Not on file    Minutes per session: Not on file  . Stress: Not on file  Relationships  . Social connections:    Talks on phone: Not on file    Gets together: Not on file    Attends religious service: Not on file    Active member of club or organization: Not on file    Attends meetings of clubs or organizations: Not on file    Relationship status: Not on file  . Intimate  partner violence:    Fear of current or ex partner: Not on file    Emotionally abused: Not on file    Physically abused: Not on file    Forced sexual activity: Not on file  Other Topics Concern  . Not on file  Social History Narrative  . Not on file  ,  reports that she has never smoked. She has never used smokeless tobacco. She reports that she does not drink alcohol or use drugs.   Family History   Her family history includes Cancer in her mother and sister; Heart disease in her brother and father; Stroke in her father.   Allergies Allergies  Allergen Reactions  . Morphine  And Related Hives  . Chlorhexidine Itching    Possible reaction to chlorhexidine - Dr. Nelva Bush used to cleanse site prior to cortisone shot (pt not sure if this is correct name of cleanser) 06/15/15  . Scopolamine Other (See Comments)    Jittery, hyperactivity     Home Medications  Prior to Admission medications   Medication Sig Start Date End Date Taking? Authorizing Provider  escitalopram (LEXAPRO) 20 MG tablet Take 20 mg by mouth daily.    Yes [provider]  fluticasone (FLONASE) 50 MCG/ACT nasal spray Place 2 sprays into both nostrils daily. 01/14/18  Yes [provider]  ibuprofen (ADVIL,MOTRIN) 200 MG tablet Take 400-600 mg by mouth 2 (two) times daily as needed for moderate pain.    Yes [provider]  levothyroxine (SYNTHROID, LEVOTHROID) 75 MCG tablet Take 75 mcg by mouth daily before breakfast. 05/24/15  Yes [provider]  Multiple Vitamin (MULTIVITAMIN WITH MINERALS) TABS tablet Take 1 tablet by mouth daily. Centrum Silver Women over 50   Yes [provider]  MYRBETRIQ 25 MG TB24 tablet Take 25 mg by mouth daily. 01/26/18  Yes [provider]  polyethylene glycol (MIRALAX / GLYCOLAX) packet Take 17 g by mouth daily.   Yes [provider]  simvastatin (ZOCOR) 40 MG tablet Take 40 mg by mouth every evening. 11/24/17  Yes [provider]  traZODone (DESYREL) 100 MG tablet Take 50-100 mg by mouth at bedtime as needed for sleep. 12/25/17  Yes [provider]  cephALEXin (KEFLEX) 500 MG capsule Take 1 capsule (500 mg total) by mouth 3 (three) times daily. Patient not taking: Reported on 02/15/2018 09/28/16   Nehemiah Settle, NP        Risk of decompensation still significantly high at present continue anticoagulation

## 2018-02-16 NOTE — Progress Notes (Signed)
PROGRESS NOTE    Jean King  DDU:[id number] DOB: July 23, 1935 DOA: 02/15/2018 PCP: Josetta Huddle, MD    Brief Narrative: Jean King is a 82 y.o. 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  Patient states that her discomfort has been progressing over that time and that she has been having difficulty sleeping due to her symptoms.    Chest x-ray showed no acute findings.  CTA chest showed bilateral segmental PE with extensive clot burden in the subsegmental pulmonary arteries.  Patient started on Lovenox while in the ED.  Critical care was involved and recommended admission to stepdown given concern for right heart strain.   Assessment & Plan:   Active Problems:   Pulmonary emboli (HCC)   Acute Bilateral pulmonary emboli with sings of right heart strain:  Admit to step down, started on Lovenox inj.  Transition in the next 24 hours to either xarelto or eliquis .  Get echocardiogram and venous duplex of the lower extremities to check for DVT.  [place] oxygen to keep sats greater than 90%.    Hypothyroidism:  Resume synthroid.    Depression and anxiety:  Resume lexapro.    overactive bladder:  Continue Myrbetriq.    DVT prophylaxis:Lovenox.  Code Status:  Full code.  Family Communication: one of the daughter's at bedside Disposition Plan: pending resolution of respiratory distress, wean her off oxygen, clinical improvement and further work up with echo and venous duplex.   Consultants:   None.   Procedures: none. Antimicrobials: none.   Subjective: Pt reports her breathing has improved. And chest pain better,   Objective: Vitals:   02/16/18 1000 02/16/18 1030 02/16/18 1100 02/16/18 1108  BP:    (!) 131/49  Pulse: 92 82 80 79  Resp: 16 20 17  (!) 31  Temp:       TempSrc:      SpO2: 99% 100% 98% 98%  Weight:      Height:       No intake or output data in the 24 hours ending 02/16/18 1150 Filed Weights   02/15/18 1602 02/15/18 2127 02/16/18 0213  Weight: 93 kg 95.8 kg 95.8 kg    Examination:  General exam: tachypneic on 2 lit of [place] oxygen but not in distress.  Respiratory system: Clear to auscultation. Respiratory effort normal. Cardiovascular system: S1 & S2 heard, RRR. No JVD, murmurs, No pedal edema. Gastrointestinal system: Abdomen is nondistended, soft and nontender. No organomegaly or masses felt. Normal bowel sounds heard. Central nervous system: Alert and oriented. No focal neurological deficits. Extremities: Symmetric 5 x 5 power. Skin: No rashes, lesions or ulcers Psychiatry:  Mood & affect appropriate.     Data Reviewed: I have personally reviewed following labs and imaging studies  CBC: Recent Labs  Lab 02/15/18 1800 02/16/18 0256  WBC 7.4 8.1  NEUTROABS 4.9  --   HGB 12.1 11.5*  HCT 37.5 34.8*  MCV 94.5 93.3  PLT 222 237   Basic Metabolic Panel: Recent Labs  Lab 02/15/18 1800 02/16/18 0256  NA 143 144  K 3.3* 3.6  CL 108 110  CO2 25 24  GLUCOSE 99 111*  BUN 13 12  CREATININE 1.07* 0.97  CALCIUM 9.0 9.0  MG  --  1.8  PHOS  --  3.3  GFR: Estimated Creatinine Clearance: 51 mL/min (by C-G formula based on SCr of 0.97 mg/dL). Liver Function Tests: Recent Labs  Lab 02/15/18 1800  AST 21  ALT 11  ALKPHOS 76  BILITOT 0.8  PROT 6.8  ALBUMIN 3.8   Recent Labs  Lab 02/15/18 1800  LIPASE 36   No results for input(s): AMMONIA in the last 168 hours. Coagulation Profile: Recent Labs  Lab 02/15/18 2055  INR 0.97   Cardiac Enzymes: No results for input(s): CKTOTAL, CKMB, CKMBINDEX, TROPONINI in the last 168 hours. BNP (last 3 results) No results for input(s): PROBNP in the last 8760 hours. HbA1C: No results for input(s): HGBA1C in the last 72 hours. CBG: Recent Labs  Lab 02/16/18 0740    GLUCAP 104*   Lipid Profile: No results for input(s): CHOL, HDL, LDLCALC, TRIG, CHOLHDL, LDLDIRECT in the last 72 hours. Thyroid Function Tests: Recent Labs    02/16/18 0256  TSH 3.805   Anemia Panel: No results for input(s): VITAMINB12, FOLATE, FERRITIN, TIBC, IRON, RETICCTPCT in the last 72 hours. Sepsis Labs: No results for input(s): PROCALCITON, LATICACIDVEN in the last 168 hours.  Recent Results (from the past 240 hour(s))  MRSA PCR Screening     Status: None   Collection Time: 02/15/18  9:28 PM  Result Value Ref Range Status   MRSA by PCR NEGATIVE NEGATIVE Final    Comment:        The GeneXpert MRSA Assay (FDA approved for NASAL specimens only), is one component of a comprehensive MRSA colonization surveillance program. It is not intended to diagnose MRSA infection nor to guide or monitor treatment for MRSA infections. Performed at [facility], [place] [address], [place], [place] [address]          Radiology Studies: Dg Chest 2 View  Result Date: 02/15/2018 CLINICAL DATA:  82 year old female with shortness breath and mid back pain and left arm since last night. Initial encounter. EXAM: CHEST - 2 VIEW COMPARISON:  10/27/2017 chest x-ray. FINDINGS: Stable minimal peribronchial thickening. No infiltrate, congestive heart failure or pneumothorax. Heart size within normal limits. Calcified aorta. No acute osseous abnormality noted. IMPRESSION: 1. Chronic minimal peribronchial thickening. 2. No infiltrate or congestive heart failure. 3.  Aortic Atherosclerosis (ICD10-I70.0). Electronically Signed   By: Genia Del M.D.   On: 02/15/2018 18:12   Ct Angio Chest Pe W And/or Wo Contrast  Result Date: 02/15/2018 CLINICAL DATA:  Shortness of breath and chest pain EXAM: CT ANGIOGRAPHY CHEST WITH CONTRAST TECHNIQUE: Multidetector CT imaging of the chest was performed using the standard protocol during bolus administration of intravenous contrast. Multiplanar  CT image reconstructions and MIPs were obtained to evaluate the vascular anatomy. CONTRAST:  152mL ISOVUE-370 IOPAMIDOL (ISOVUE-370) INJECTION 76% COMPARISON:  02/15/2018 radiograph, CT 06/15/2015 FINDINGS: Cardiovascular: Satisfactory opacification of the pulmonary arteries to the segmental level. Positive for bilateral acute pulmonary emboli with thrombus visualized in the distal right pulmonary artery and extending into right inter lobar pulmonary artery, and multiple segmental and subsegmental right upper, right middle and right lower lobe arteries. Thrombus within the distal left pulmonary artery, extending into segmental and subsegmental left upper and lower lobe pulmonary arteries. Positive for right heart strain with RV LV ratio of 2.1. Moderate aortic atherosclerosis. Nonaneurysmal aorta. Heart size within normal limits. No pericardial effusion Mediastinum/Nodes: Midline trachea. No thyroid mass. No significant mediastinal adenopathy. Esophagus within normal limits. Lungs/Pleura: Lungs are clear. No pleural effusion or pneumothorax. Upper Abdomen: No acute abnormality. Musculoskeletal: No chest wall  abnormality. No acute or significant osseous findings. Chronic superior endplate deformity at J69 Review of the MIP images confirms the above findings. IMPRESSION: Positive for acute bilateral right greater than left PE with CT evidence of right heart strain (RV/LV Ratio = 2.1) consistent with at least submassive (intermediate risk) PE. The presence of right heart strain has been associated with an increased risk of morbidity and mortality. Please activate Code PE by paging [phone]. Aortic Atherosclerosis (ICD10-I70.0). Critical Value/emergent results were called by telephone at the time of interpretation on 02/15/2018 at 7:49 pm to Dr. Margarita Mail, who verbally acknowledged these results. Electronically Signed   By: Donavan Foil M.D.   On: 02/15/2018 19:49        Scheduled Meds: . docusate  sodium  100 mg Oral BID  . enoxaparin (LOVENOX) injection  1 mg/kg Subcutaneous Q12H  . escitalopram  20 mg Oral Daily  . levothyroxine  75 mcg Oral QAC breakfast  . mirabegron ER  25 mg Oral Daily  . senna  1 tablet Oral BID  . simvastatin  40 mg Oral QPM  . sodium chloride flush  3 mL Intravenous Q12H   Continuous Infusions:   LOS: 1 day    Time spent: 35 minutes   Hosie Poisson, MD [facility] Pager [phone] If 7PM-7AM, please contact night-coverage [email or web address] Password [email or web address] 02/16/2018, 11:50 AM

## 2018-02-16 NOTE — Care Management Note (Addendum)
Case Management Note  Patient Details  Name: Jean King MRN: [id number] Date of Birth: 26-Apr-1936  Subjective/Objective:                  Discharge planning $95.00 Action/Plan: Benefits check for lovenox. copay commerically for the lovenox is  Expected Discharge Date:                  Expected Discharge Plan:     In-House Referral:     Discharge planning Services     Post Acute Care Choice:    Choice offered to:     DME Arranged:    DME Agency:     HH Arranged:    HH Agency:     Status of Service:     If discussed at [employer], dates discussed:    Additional Comments:  Leeroy Cha, RN 02/16/2018, 8:33 AM

## 2018-02-16 NOTE — Progress Notes (Signed)
2D echo has been performed. °

## 2018-02-17 DIAGNOSIS — F32A Depression, unspecified: Secondary | ICD-10-CM | POA: Diagnosis present

## 2018-02-17 DIAGNOSIS — F329 Major depressive disorder, single episode, unspecified: Secondary | ICD-10-CM

## 2018-02-17 DIAGNOSIS — E039 Hypothyroidism, unspecified: Secondary | ICD-10-CM | POA: Diagnosis present

## 2018-02-17 DIAGNOSIS — I2699 Other pulmonary embolism without acute cor pulmonale: Secondary | ICD-10-CM

## 2018-02-17 DIAGNOSIS — F419 Anxiety disorder, unspecified: Secondary | ICD-10-CM | POA: Diagnosis present

## 2018-02-17 LAB — BASIC METABOLIC PANEL
Anion gap: 7 (ref 5–15)
BUN: 15 mg/dL (ref 8–23)
CALCIUM: 8.7 mg/dL — AB (ref 8.9–10.3)
CO2: 25 mmol/L (ref 22–32)
CREATININE: 1.06 mg/dL — AB (ref 0.44–1.00)
Chloride: 111 mmol/L (ref 98–111)
GFR calc Af Amer: 56 mL/min — ABNORMAL LOW (ref >60)
GFR, EST NON AFRICAN AMERICAN: 48 mL/min — AB (ref >60)
Glucose, Bld: 122 mg/dL — ABNORMAL HIGH (ref 70–99)
Potassium: 3.6 mmol/L (ref 3.5–5.1)
Sodium: 143 mmol/L (ref 135–145)

## 2018-02-17 LAB — CBC WITH DIFFERENTIAL/PLATELET
Abs Immature Granulocytes: 0.03 10*3/uL (ref 0.00–0.07)
BASOS PCT: 1 %
Basophils Absolute: 0.1 10*3/uL (ref 0.0–0.1)
EOS PCT: 6 %
Eosinophils Absolute: 0.4 10*3/uL (ref 0.0–0.5)
HCT: 35.3 % — ABNORMAL LOW (ref 36.0–46.0)
Hemoglobin: 11.4 g/dL — ABNORMAL LOW (ref 12.0–15.0)
Immature Granulocytes: 1 %
Lymphocytes Relative: 31 %
Lymphs Abs: 1.9 10*3/uL (ref 0.7–4.0)
MCH: 30.3 pg (ref 26.0–34.0)
MCHC: 32.3 g/dL (ref 30.0–36.0)
MCV: 93.9 fL (ref 80.0–100.0)
MONO ABS: 0.5 10*3/uL (ref 0.1–1.0)
Monocytes Relative: 9 %
Neutro Abs: 3.2 10*3/uL (ref 1.7–7.7)
Neutrophils Relative %: 52 %
PLATELETS: 210 10*3/uL (ref 150–400)
RBC: 3.76 MIL/uL — ABNORMAL LOW (ref 3.87–5.11)
RDW: 12.5 % (ref 11.5–15.5)
WBC: 6.1 10*3/uL (ref 4.0–10.5)
nRBC: 0 % (ref 0.0–0.2)

## 2018-02-17 LAB — GLUCOSE, CAPILLARY: Glucose-Capillary: 107 mg/dL — ABNORMAL HIGH (ref 70–99)

## 2018-02-17 LAB — MAGNESIUM: MAGNESIUM: 1.8 mg/dL (ref 1.7–2.4)

## 2018-02-17 MED ORDER — APIXABAN 5 MG PO TABS
10.0000 mg | ORAL_TABLET | Freq: Two times a day (BID) | ORAL | Status: DC
  Administered 2018-02-17 – 2018-02-18 (×3): 10 mg via ORAL
  Filled 2018-02-17 (×3): qty 2

## 2018-02-17 MED ORDER — SODIUM CHLORIDE 0.9 % IV SOLN
INTRAVENOUS | Status: DC | PRN
  Administered 2018-02-17: 250 mL via INTRAVENOUS

## 2018-02-17 MED ORDER — MAGNESIUM SULFATE 2 GM/50ML IV SOLN
2.0000 g | Freq: Once | INTRAVENOUS | Status: AC
  Administered 2018-02-17: 2 g via INTRAVENOUS
  Filled 2018-02-17: qty 50

## 2018-02-17 MED ORDER — ORAL CARE MOUTH RINSE
15.0000 mL | Freq: Two times a day (BID) | OROMUCOSAL | Status: DC
  Administered 2018-02-18: 15 mL via OROMUCOSAL

## 2018-02-17 MED ORDER — APIXABAN 5 MG PO TABS: 5.0000 mg | ORAL_TABLET | Freq: Two times a day (BID) | ORAL | Status: DC

## 2018-02-17 NOTE — Discharge Instructions (Signed)
Information on my medicine - ELIQUIS (apixaban)  Why was Eliquis prescribed for you? Eliquis was prescribed to treat blood clots that may have been found in the veins of your legs (deep vein thrombosis) or in your lungs (pulmonary embolism) and to reduce the risk of them occurring again.  What do You need to know about Eliquis ? The starting dose is 10 mg (two 5 mg tablets) taken TWICE daily for the FIRST SEVEN (7) DAYS, then on 02/24/18 (DAY 8),  the dose is reduced to ONE 5 mg tablet taken TWICE daily.  Eliquis may be taken with or without food.   Try to take the dose about the same time in the morning and in the evening. If you have difficulty swallowing the tablet whole please discuss with your pharmacist how to take the medication safely.  Take Eliquis exactly as prescribed and DO NOT stop taking Eliquis without talking to the doctor who prescribed the medication.  Stopping may increase your risk of developing a new blood clot.  Refill your prescription before you run out.  After discharge, you should have regular check-up appointments with your healthcare provider that is prescribing your Eliquis.    What do you do if you miss a dose? If a dose of ELIQUIS is not taken at the scheduled time, take it as soon as possible on the same day and twice-daily administration should be resumed. The dose should not be doubled to make up for a missed dose.  Important Safety Information A possible side effect of Eliquis is bleeding. You should call your healthcare provider right away if you experience any of the following: ? Bleeding from an injury or your nose that does not stop. ? Unusual colored urine (red or dark brown) or unusual colored stools (red or black). ? Unusual bruising for unknown reasons. ? A serious fall or if you hit your head (even if there is no bleeding).  Some medicines may interact with Eliquis and might increase your risk of bleeding or clotting while on Eliquis. To  help avoid this, consult your healthcare provider or pharmacist prior to using any new prescription or non-prescription medications, including herbals, vitamins, non-steroidal anti-inflammatory drugs (NSAIDs) and supplements.  This website has more information on Eliquis (apixaban): http://www.eliquis.com/eliquis/home

## 2018-02-17 NOTE — Evaluation (Signed)
Physical Therapy Evaluation Patient Details Name: Jean King MRN: [id number] DOB: October 06, 1935 Today's Date: 02/17/2018   History of Present Illness  82 y.o. 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  Pt admitted for acute submassive bilateral pulmonary emboli with right ventricular strain on CT scan  Clinical Impression  Pt admitted with above diagnosis. Pt currently with functional limitations due to the deficits listed below (see PT Problem List). Pt will benefit from skilled PT to increase their independence and safety with mobility to allow discharge to the venue listed below.   Pt reports she has poor balance at baseline due to hx of Meniere's and she admittedly furniture walks in apt.  Pt uses "walking stick" if ambulating in community.  Pt reports she no longer drives.  Pt ambulated in hallway and SPO2 93% on room air at lowest.  Pt agreeable to HHPT upon d/c to improve balance, strength and endurance.       Follow Up Recommendations Home health PT    Equipment Recommendations  None recommended by PT    Recommendations for Other Services       Precautions / Restrictions Precautions Precautions: Fall Precaution Comments: monitor sats      Mobility  Bed Mobility Overal bed mobility: Modified Independent                Transfers Overall transfer level: Needs assistance Equipment used: None Transfers: Sit to/from Stand Sit to Stand: Min guard;Supervision         General transfer comment: for safety  Ambulation/Gait Ambulation/Gait assistance: Min guard Gait Distance (Feet): 200 Feet Assistive device: None Gait Pattern/deviations: Step-through pattern;Decreased stride length     General Gait Details: slightly unsteady at times however pt self  corrects, occasional holding hand rail or wall, SPO2 93% at lowest on room air; pt reports mild dyspnea  Stairs            Wheelchair Mobility    Modified Rankin (Stroke Patients Only)       Balance Overall balance assessment: Mild deficits observed, not formally tested         Standing balance support: No upper extremity supported Standing balance-Leahy Scale: Fair Standing balance comment: not able to tolerate challenges, reports hx of Meniere's and poor balance at baseline                             Pertinent Vitals/Pain Pain Assessment: Faces Faces Pain Scale: Hurts a little bit Pain Location: head Pain Descriptors / Indicators: Aching Pain Intervention(s): Limited activity within patient's tolerance;Monitored during session;Repositioned;Patient requesting pain meds-RN notified    Home Living Family/patient expects to be discharged to:: Private residence Living Arrangements: Alone   Type of Home: Apartment Home Access: Elevator     Home Layout: One level Home Equipment: Environmental consultant - 2 wheels;Cane - single point      Prior Function Level of Independence: Independent         Comments: typically ambulates without assistive device in apt however admittedly furniture walks - reports hx of Meniere's     Hand Dominance        Extremity/Trunk Assessment        Lower Extremity Assessment Lower Extremity Assessment: Generalized weakness(reports knee OA)  Communication   Communication: HOH  Cognition Arousal/Alertness: Awake/alert Behavior During Therapy: WFL for tasks assessed/performed Overall Cognitive Status: Within Functional Limits for tasks assessed                                        General Comments      Exercises     Assessment/Plan    PT Assessment Patient needs continued PT services  PT Problem List Decreased strength;Decreased mobility;Decreased activity tolerance;Decreased knowledge of use of  DME       PT Treatment Interventions DME instruction;Therapeutic activities;Gait training;Therapeutic exercise;Patient/family education;Functional mobility training;Balance training    PT Goals (Current goals can be found in the Care Plan section)  Acute Rehab PT Goals PT Goal Formulation: With patient Time For Goal Achievement: 03/03/18 Potential to Achieve Goals: Good    Frequency Min 3X/week   Barriers to discharge        Co-evaluation               AM-PAC PT "6 Clicks" Daily Activity  Outcome Measure Difficulty turning over in bed (including adjusting bedclothes, sheets and blankets)?: None Difficulty moving from lying on back to sitting on the side of the bed? : None Difficulty sitting down on and standing up from a chair with arms (e.g., wheelchair, bedside commode, etc,.)?: A Little Help needed moving to and from a bed to chair (including a wheelchair)?: A Little Help needed walking in hospital room?: A Little Help needed climbing 3-5 steps with a railing? : A Little 6 Click Score: 20    End of Session Equipment Utilized During Treatment: Gait belt Activity Tolerance: Patient tolerated treatment well Patient left: in bed;with bed alarm set;with call bell/phone within reach Nurse Communication: Mobility status;Patient requests pain meds PT Visit Diagnosis: Other abnormalities of gait and mobility (R26.89);Unsteadiness on feet (R26.81)    Time: 7096-4383 PT Time Calculation (min) (ACUTE ONLY): 20 min   Charges:   PT Evaluation $PT Eval Low Complexity: [place], PT, DPT Acute Rehabilitation Services Office: [phone] Pager: [phone]   Trena Platt 02/17/2018, 4:08 PM

## 2018-02-17 NOTE — Progress Notes (Signed)
PROGRESS NOTE    Jean King  ALP:[id number] DOB: 1935/07/07 DOA: 02/15/2018 PCP: Josetta Huddle, MD    Brief Narrative:  Jean Apuzzo Turneris a 82 y.o.femalewith medical history significant forDVT/PE 6-7 years ago treated with 3-6 months of warfarin,hypothyroidism and depression/anxiety who presented to the ED with 3 to 4 days of chest pain and shortness of breath both with exertion and while lying flat. Patient states that her discomfort has been progressing over that time and that she has been having difficulty sleeping due to her symptoms.  Chest x-ray showed no acute findings. CTA chest showed bilateral segmental PE with extensive clot burden in the subsegmental pulmonary arteries. Patient started on Lovenox while in the ED. Critical care was involved and recommended admission to stepdown given concern for right heart strain.   Assessment & Plan:   Principal Problem:   Pulmonary embolism, bilateral (HCC) Active Problems:   Hypothyroidism   Depression   Anxiety  #1 acute submassive bilateral pulmonary emboli with right ventricular strain on CT scan Patient improving clinically.  Patient currently hemodynamically stable.  Patient on nasal cannula with O2 sats of 97%.  Lower extremity Dopplers which were done were negative for DVT.  2D echo done with EF of 55 to 60%, no wall motion abnormalities, mildly dilated right ventricle, mild to moderate tricuspid valvular regurgitation, PA peak pressure of 60 mmHg.  Patient currently on full dose Lovenox.  Will likely transition to Eliquis.  Patient seen by critical care medicine and at this time as patient is hemodynamically stable no need for intervention at this time.  Patient has had 2 embolic events in the past and as such requires lifelong anticoagulation which is recommended.  Continue Lovenox for now.  Transition to Eliquis.  Appreciate critical care input and recommendations.  2.  Hypothyroidism TSH at 3.805.  Continue home dose  Synthroid.  Outpatient follow-up with PCP.  3.  Depression/anxiety Stable.  Continue home regimen of Lexapro.  4.  Overactive bladder Continue Myrbetriq.   DVT prophylaxis: Lovenox Code Status: Full Family Communication: Updated patient.  No family at bedside. Disposition Plan: Transfer to telemetry.   Consultants:   PCCM: Dr. Ander Slade 02/15/2018  Procedures:   CT angiogram chest 02/15/2018  Chest x-ray 02/15/2018  Lower extremity Dopplers 02/16/2018  2D echo 02/16/2018  Antimicrobials:   None   Subjective: Patient laying in bed on oxygen states he is feeling better than on admission however not at baseline.  Denies any current chest pain.  States shortness of breath has improved since admission.  Denies any bilateral upper back pain.  Denies any bleeding.  Objective: Vitals:   02/17/18 0400 02/17/18 0404 02/17/18 0600 02/17/18 0800  BP: (!) 146/60     Pulse: 72  70   Resp: (!) 8  14   Temp:    97.8 F (36.6 C)  TempSrc:    Oral  SpO2: 95%  97%   Weight:  96.4 kg    Height:       No intake or output data in the 24 hours ending 02/17/18 0924 Filed Weights   02/15/18 2127 02/16/18 0213 02/17/18 0404  Weight: 95.8 kg 95.8 kg 96.4 kg    Examination:  General exam: Appears calm and comfortable  Respiratory system: Clear to auscultation. Respiratory effort normal. Cardiovascular system: S1 & S2 heard, RRR. No JVD, murmurs, rubs, gallops or clicks. No pedal edema. Gastrointestinal system: Abdomen is nondistended, soft and nontender. No organomegaly or masses felt. Normal bowel sounds heard.  Central nervous system: Alert and oriented. No focal neurological deficits. Extremities: Symmetric 5 x 5 power. Skin: No rashes, lesions or ulcers Psychiatry: Judgement and insight appear normal. Mood & affect appropriate.     Data Reviewed: I have personally reviewed following labs and imaging studies  CBC: Recent Labs  Lab 02/15/18 1800 02/16/18 0256  02/17/18 0754  WBC 7.4 8.1 6.1  NEUTROABS 4.9  --  3.2  HGB 12.1 11.5* 11.4*  HCT 37.5 34.8* 35.3*  MCV 94.5 93.3 93.9  PLT 222 197 528   Basic Metabolic Panel: Recent Labs  Lab 02/15/18 1800 02/16/18 0256 02/17/18 0754  NA 143 144 143  K 3.3* 3.6 3.6  CL 108 110 111  CO2 25 24 25   GLUCOSE 99 111* 122*  BUN 13 12 15   CREATININE 1.07* 0.97 1.06*  CALCIUM 9.0 9.0 8.7*  MG  --  1.8 1.8  PHOS  --  3.3  --    GFR: Estimated Creatinine Clearance: 46.8 mL/min (A) (by C-G formula based on SCr of 1.06 mg/dL (H)). Liver Function Tests: Recent Labs  Lab 02/15/18 1800  AST 21  ALT 11  ALKPHOS 76  BILITOT 0.8  PROT 6.8  ALBUMIN 3.8   Recent Labs  Lab 02/15/18 1800  LIPASE 36   No results for input(s): AMMONIA in the last 168 hours. Coagulation Profile: Recent Labs  Lab 02/15/18 2055  INR 0.97   Cardiac Enzymes: No results for input(s): CKTOTAL, CKMB, CKMBINDEX, TROPONINI in the last 168 hours. BNP (last 3 results) No results for input(s): PROBNP in the last 8760 hours. HbA1C: No results for input(s): HGBA1C in the last 72 hours. CBG: Recent Labs  Lab 02/16/18 0740 02/17/18 0809  GLUCAP 104* 107*   Lipid Profile: No results for input(s): CHOL, HDL, LDLCALC, TRIG, CHOLHDL, LDLDIRECT in the last 72 hours. Thyroid Function Tests: Recent Labs    02/16/18 0256  TSH 3.805   Anemia Panel: No results for input(s): VITAMINB12, FOLATE, FERRITIN, TIBC, IRON, RETICCTPCT in the last 72 hours. Sepsis Labs: No results for input(s): PROCALCITON, LATICACIDVEN in the last 168 hours.  Recent Results (from the past 240 hour(s))  MRSA PCR Screening     Status: None   Collection Time: 02/15/18  9:28 PM  Result Value Ref Range Status   MRSA by PCR NEGATIVE NEGATIVE Final    Comment:        The GeneXpert MRSA Assay (FDA approved for NASAL specimens only), is one component of a comprehensive MRSA colonization surveillance program. It is not intended to diagnose  MRSA infection nor to guide or monitor treatment for MRSA infections. Performed at [facility], [place] [address] [place], [place] [address]          Radiology Studies: Dg Chest 2 View  Result Date: 02/15/2018 CLINICAL DATA:  82 year old female with shortness breath and mid back pain and left arm since last night. Initial encounter. EXAM: CHEST - 2 VIEW COMPARISON:  10/27/2017 chest x-ray. FINDINGS: Stable minimal peribronchial thickening. No infiltrate, congestive heart failure or pneumothorax. Heart size within normal limits. Calcified aorta. No acute osseous abnormality noted. IMPRESSION: 1. Chronic minimal peribronchial thickening. 2. No infiltrate or congestive heart failure. 3.  Aortic Atherosclerosis (ICD10-I70.0). Electronically Signed   By: Genia Del M.D.   On: 02/15/2018 18:12   Ct Angio Chest Pe W And/or Wo Contrast  Result Date: 02/15/2018 CLINICAL DATA:  Shortness of breath and chest pain EXAM: CT ANGIOGRAPHY CHEST WITH CONTRAST TECHNIQUE: Multidetector  CT imaging of the chest was performed using the standard protocol during bolus administration of intravenous contrast. Multiplanar CT image reconstructions and MIPs were obtained to evaluate the vascular anatomy. CONTRAST:  162mL ISOVUE-370 IOPAMIDOL (ISOVUE-370) INJECTION 76% COMPARISON:  02/15/2018 radiograph, CT 06/15/2015 FINDINGS: Cardiovascular: Satisfactory opacification of the pulmonary arteries to the segmental level. Positive for bilateral acute pulmonary emboli with thrombus visualized in the distal right pulmonary artery and extending into right inter lobar pulmonary artery, and multiple segmental and subsegmental right upper, right middle and right lower lobe arteries. Thrombus within the distal left pulmonary artery, extending into segmental and subsegmental left upper and lower lobe pulmonary arteries. Positive for right heart strain with RV LV ratio of 2.1. Moderate aortic atherosclerosis.  Nonaneurysmal aorta. Heart size within normal limits. No pericardial effusion Mediastinum/Nodes: Midline trachea. No thyroid mass. No significant mediastinal adenopathy. Esophagus within normal limits. Lungs/Pleura: Lungs are clear. No pleural effusion or pneumothorax. Upper Abdomen: No acute abnormality. Musculoskeletal: No chest wall abnormality. No acute or significant osseous findings. Chronic superior endplate deformity at C58 Review of the MIP images confirms the above findings. IMPRESSION: Positive for acute bilateral right greater than left PE with CT evidence of right heart strain (RV/LV Ratio = 2.1) consistent with at least submassive (intermediate risk) PE. The presence of right heart strain has been associated with an increased risk of morbidity and mortality. Please activate Code PE by paging [phone]. Aortic Atherosclerosis (ICD10-I70.0). Critical Value/emergent results were called by telephone at the time of interpretation on 02/15/2018 at 7:49 pm to Dr. Margarita Mail, who verbally acknowledged these results. Electronically Signed   By: Donavan Foil M.D.   On: 02/15/2018 19:49        Scheduled Meds: . docusate sodium  100 mg Oral BID  . enoxaparin (LOVENOX) injection  1 mg/kg Subcutaneous Q12H  . escitalopram  20 mg Oral Daily  . levothyroxine  75 mcg Oral QAC breakfast  . mirabegron ER  25 mg Oral Daily  . senna  1 tablet Oral BID  . simvastatin  40 mg Oral QPM  . sodium chloride flush  3 mL Intravenous Q12H   Continuous Infusions:   LOS: 2 days    Time spent: 40 minutes    Irine Seal, MD [facility] Pager [phone]  If 7PM-7AM, please contact night-coverage [email or web address] Password [email or web address] 02/17/2018, 9:24 AM

## 2018-02-17 NOTE — Progress Notes (Addendum)
[place] for Enoxaparin --> Apixaban  Indication: submassive bilateral PE  Allergies  Allergen Reactions  . Morphine And Related Hives  . Chlorhexidine Itching    Possible reaction to chlorhexidine - Dr. Nelva Bush used to cleanse site prior to cortisone shot (pt not sure if this is correct name of cleanser) 06/15/15  . Scopolamine Other (See Comments)    Jittery, hyperactivity    Patient Measurements: Height: 5\' 3"  (160 cm) Weight: 212 lb 8.4 oz (96.4 kg) IBW/kg (Calculated) : 52.4   Vital Signs: Temp: 97.8 F (36.6 C) (10/16 0800) Temp Source: Oral (10/16 0800) BP: 146/60 (10/16 0400) Pulse Rate: 70 (10/16 0600)  Labs: Recent Labs    02/15/18 1800 02/15/18 2055 02/16/18 0256 02/17/18 0754  HGB 12.1  --  11.5* 11.4*  HCT 37.5  --  34.8* 35.3*  PLT 222  --  197 210  APTT  --  30  --   --   LABPROT  --  12.8  --   --   INR  --  0.97  --   --   CREATININE 1.07*  --  0.97 1.06*    Estimated Creatinine Clearance: 46.8 mL/min (A) (by C-G formula based on SCr of 1.06 mg/dL (H)).   Medical History: Past Medical History:  Diagnosis Date  . Cataract   . Depression   . GERD (gastroesophageal reflux disease)   . Thyroid disease     Assessment: 57 y/oF who presented to [facility] ED on 02/15/18 with dyspnea and back pain x 24 hours. Patient has PMH of PE and DVT in August 2013. CTa of chest + for acute bilateral PE with evidence of right heart strain. Pharmacy consulted for Enoxaparin dosing per admitting provider. Patient not on any anticoagulants PTA.   Today, 02/17/18:  Asked to transition to Apixaban per MD-last dose of Enoxaparin given 02/16/18 at 2258  CBC: Hgb 11.4, Pltc WNL  SCr 1.06 with CrCl ~ 47 ml/min  No bleeding issues reported     Plan:   D/C Enoxaparin now  Start Apixaban at 10am: 10mg  PO BID x 7 days, then 5mg  PO BID  Monitor CBC, renal function, and for s/sx of bleeding  Pharmacy to provide education prior to  discharge    Lindell Spar, PharmD, BCPS Pager: [phone] 02/17/2018 9:42 AM

## 2018-02-17 NOTE — Progress Notes (Addendum)
Patient Saturations on Room Air at Rest = 98%  Patient Saturations on Room Air while Ambulating = Sustained about 95%; lowest was 92%  Patient states she became slightly short of breath after ambulating. Patient requested to be placed back on 2L [place].

## 2018-02-17 NOTE — Progress Notes (Signed)
NAME:  Jean King, MRN:  [id number], DOB:  [birth year], LOS: 2 ADMISSION DATE:  02/15/2018, CONSULTATION DATE: 02/15/2018 REFERRING MD:  Karleen Hampshire, CHIEF COMPLAINT: Shortness of breath  Brief History   82 year old lady, came into the ED with shortness of breath and back pain for 48 hours.  CTA chest consistent with bilateral PE. PMH of prior PE tibial DVT in August 2013  Past Medical History  Depression, GERD, hypothyroidism, HLD, trigeminal neuralgia, hearing loss  Significant Hospital Events   10/14  Admit with SOB, Dx with bilateral PE  Consults: date of consult/date signed off & final recs:  PCCM 10/14   Procedures (surgical and bedside):  ECHO 10/15 >> LVEF 55-60%, no wall motion abnormalities, grade 2 diastolic dysfunction, RV mildly dilated, mild to mod TR, PA Peak pressure 60 mmHg  Significant Diagnostic Tests:  CTA Chest 10/14 >> bilateral pulmonary embolism, right ventricle: Left ventricle ratio 2:1 Venous Duplex 10/15 >> no evidence of DVT bilaterally   Micro Data:    Antimicrobials:     Subjective:   Pt reports feeling much better.  Denies chest / back pain.  On [place] O2.    Objective   Blood pressure (!) 146/60, pulse 70, temperature 98.1 F (36.7 C), temperature source Oral, resp. rate 14, height 5\' 3"  (1.6 m), weight 96.4 kg, SpO2 97 %.       No intake or output data in the 24 hours ending 02/17/18 0839 Filed Weights   02/15/18 2127 02/16/18 0213 02/17/18 0404  Weight: 95.8 kg 95.8 kg 96.4 kg    Examination: General: elderly female sitting in bed in NAD HEENT: MM pink/moist, pupils =/R Neuro: AAOx4, speech clear, MAE CV: s1s2 rrr, no m/r/g PULM: even/non-labored, lungs bilaterally clear  OE:UMPN, non-tender, bsx4 active  Extremities: warm/dry, no edema  Skin: no rashes or lesions  Resolved Hospital Problem list     Assessment & Plan:   Acute submassive pulmonary embolism with right ventricular strain on CT & ECHO - prior hx of 2 prior embolic  events P: Will need lifelong anticoagulation  No role for IVC filter Continue lovenox at therapeutic dosing with transition to NOAC / Eliquis   Wean O2 for saturations >90% (currently on room air in no distress) Can be considered for EKOS if decompensates  Can follow up with primary MD as outpatient   Disposition / Summary of Today's Plan 02/17/18   Transition to NOAC per primary MD.  Continue supportive care. PCCM will sign off.  Please call back if new needs arise.      Mobility: No strenuous activities, may ambulate Code Status: Full code  family Communication: Updated family at bedside  Labs   CBC: Recent Labs  Lab 02/15/18 1800 02/16/18 0256 02/17/18 0754  WBC 7.4 8.1 6.1  NEUTROABS 4.9  --  3.2  HGB 12.1 11.5* 11.4*  HCT 37.5 34.8* 35.3*  MCV 94.5 93.3 93.9  PLT 222 197 361    Basic Metabolic Panel: Recent Labs  Lab 02/15/18 1800 02/16/18 0256  NA 143 144  K 3.3* 3.6  CL 108 110  CO2 25 24  GLUCOSE 99 111*  BUN 13 12  CREATININE 1.07* 0.97  CALCIUM 9.0 9.0  MG  --  1.8  PHOS  --  3.3   GFR: Estimated Creatinine Clearance: 51.1 mL/min (by C-G formula based on SCr of 0.97 mg/dL). Recent Labs  Lab 02/15/18 1800 02/16/18 0256 02/17/18 0754  WBC 7.4 8.1 6.1    Liver Function Tests:  Recent Labs  Lab 02/15/18 1800  AST 21  ALT 11  ALKPHOS 76  BILITOT 0.8  PROT 6.8  ALBUMIN 3.8   Recent Labs  Lab 02/15/18 1800  LIPASE 36   No results for input(s): AMMONIA in the last 168 hours.  ABG No results found for: PHART, PCO2ART, PO2ART, HCO3, TCO2, ACIDBASEDEF, O2SAT   Coagulation Profile: Recent Labs  Lab 02/15/18 2055  INR 0.97    Cardiac Enzymes: No results for input(s): CKTOTAL, CKMB, CKMBINDEX, TROPONINI in the last 168 hours.  HbA1C: No results found for: HGBA1C  CBG: Recent Labs  Lab 02/16/18 0740 02/17/18 0809  GLUCAP 104* 107*    Noe Gens, NP-C [facility] Pgr: [phone] or if no answer  [phone] 02/17/2018, 8:40 AM

## 2018-02-17 NOTE — Care Management Note (Signed)
Case Management Note  Patient Details  Name: Jean King MRN: [id number] Date of Birth: February 09, 1936  Subjective/Objective:                  Bilateral pulmonary emobolisms, on lovenox  Action/Plan: Lives alone has family support group Will follow for progression of care and clinical status. Will follow for case management needs none present at this time.  Expected Discharge Date:  (unknown)               Expected Discharge Plan:  Home/Self Care  In-House Referral:     Discharge planning Services  CM Consult  Post Acute Care Choice:    Choice offered to:     DME Arranged:    DME Agency:     HH Arranged:    HH Agency:     Status of Service:  In process, will continue to follow  If discussed at Long Length of Stay Meetings, dates discussed:    Additional Comments:  Leeroy Cha, RN 02/17/2018, 8:24 AM

## 2018-02-18 DIAGNOSIS — E876 Hypokalemia: Secondary | ICD-10-CM

## 2018-02-18 LAB — GLUCOSE, CAPILLARY: Glucose-Capillary: 114 mg/dL — ABNORMAL HIGH (ref 70–99)

## 2018-02-18 LAB — BASIC METABOLIC PANEL
ANION GAP: 6 (ref 5–15)
BUN: 16 mg/dL (ref 8–23)
CHLORIDE: 111 mmol/L (ref 98–111)
CO2: 28 mmol/L (ref 22–32)
Calcium: 8.5 mg/dL — ABNORMAL LOW (ref 8.9–10.3)
Creatinine, Ser: 1.16 mg/dL — ABNORMAL HIGH (ref 0.44–1.00)
GFR calc Af Amer: 50 mL/min — ABNORMAL LOW (ref >60)
GFR calc non Af Amer: 43 mL/min — ABNORMAL LOW (ref >60)
Glucose, Bld: 125 mg/dL — ABNORMAL HIGH (ref 70–99)
Potassium: 3.1 mmol/L — ABNORMAL LOW (ref 3.5–5.1)
Sodium: 145 mmol/L (ref 135–145)

## 2018-02-18 LAB — CBC
HCT: 34.1 % — ABNORMAL LOW (ref 36.0–46.0)
HEMOGLOBIN: 11 g/dL — AB (ref 12.0–15.0)
MCH: 30.5 pg (ref 26.0–34.0)
MCHC: 32.3 g/dL (ref 30.0–36.0)
MCV: 94.5 fL (ref 80.0–100.0)
Platelets: 204 10*3/uL (ref 150–400)
RBC: 3.61 MIL/uL — ABNORMAL LOW (ref 3.87–5.11)
RDW: 12.5 % (ref 11.5–15.5)
WBC: 7.2 10*3/uL (ref 4.0–10.5)
nRBC: 0 % (ref 0.0–0.2)

## 2018-02-18 LAB — MAGNESIUM: Magnesium: 2 mg/dL (ref 1.7–2.4)

## 2018-02-18 MED ORDER — ELIQUIS 5 MG VTE STARTER PACK: ORAL_TABLET | ORAL | 0 refills | Status: DC

## 2018-02-18 MED ORDER — APIXABAN 5 MG PO TABS: 5.0000 mg | ORAL_TABLET | Freq: Two times a day (BID) | ORAL | 0 refills | Status: DC

## 2018-02-18 MED ORDER — POTASSIUM CHLORIDE CRYS ER 20 MEQ PO TBCR
40.0000 meq | EXTENDED_RELEASE_TABLET | ORAL | Status: AC
  Administered 2018-02-18 (×2): 40 meq via ORAL
  Filled 2018-02-18 (×2): qty 2

## 2018-02-18 NOTE — Care Management Important Message (Signed)
Important Message  Patient Details  Name: Jean King MRN: [id number] Date of Birth: 12/18/1935   Medicare Important Message Given:  Yes    Kerin Salen 02/18/2018, 11:49 AMImportant Message  Patient Details  Name: Jean King MRN: [id number] Date of Birth: 1935/08/04   Medicare Important Message Given:  Yes    Kerin Salen 02/18/2018, 11:49 AM

## 2018-02-18 NOTE — Care Management Note (Signed)
Case Management Note  Patient Details  Name: Jean King MRN: [id number] Date of Birth: 08-Aug-1935  Subjective/Objective:  Pt admitted Pulmonary Embolism               Action/Plan: Pt discharging home with [facility] for PT/OT/NA. Referral given to in house rep.    Expected Discharge Date:  (unknown)               Expected Discharge Plan:  [place]  In-House Referral:     Discharge planning Services  CM Consult  Post Acute Care Choice:    Choice offered to:     DME Arranged:    DME Agency:     HH Arranged:  PT, OT, Nurse's Aide [place] Agency:  [place]  Status of Service:  Completed, signed off  If discussed at [place] of Stay Meetings, dates discussed:    Additional CommentsPurcell Mouton, RN 02/18/2018, 9:47 AM

## 2018-02-18 NOTE — Discharge Summary (Addendum)
Physician Discharge Summary  Jean King QMG:[id number] DOB: July 11, 1935 DOA: 02/15/2018  PCP: Jean Huddle, MD  Admit date: 02/15/2018 Discharge date: 02/18/2018  Time spent: 60 minutes  Recommendations for Outpatient Follow-up:  1. Follow-up with Jean Huddle, MD in 1 to 2 weeks.  On follow-up patient will need a basic metabolic profile done to follow-up on electrolytes and renal function.  Patient also need a CBC done to follow-up on H&H.  Patient will need refills on her anticoagulation on follow-up as patient has been discharged on a 1 month supply of Eliquis.   Discharge Diagnoses:  Principal Problem:   Pulmonary embolism, bilateral (Jean King) Active Problems:   Hypothyroidism   Depression   Anxiety   Hypokalemia   Discharge Condition: Stable and improved  Diet recommendation: Heart healthy  Filed Weights   02/16/18 0213 02/17/18 0404 02/18/18 0158  Weight: 95.8 kg 96.4 kg 98.8 kg    History of present illness:  Per Jean King is a 82 y.o. 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  Patient stated that her discomfort had been progressing over that time and that she had been having difficulty sleeping due to her symptoms.  She also noted referral of her chest pain, which she described as sharp, to the back.  She reported similar symptoms with her prior PE approximate 6 to 7 years ago.  No fever, no chills, no cough, no palpitations, no abdominal pain, no nausea, no vomiting, no diarrhea.  Patient reported family history of blood clots in her father and suspected clots in her brother, who passed away at 6 suddenly.  She denies recent surgery, recent travel.  She did admit to being largely sedentary at home, however, this has not changed in recent  times.  ED Course: In the ED, patient afebrile, hemodynamically stable, mildly hypertensive and placed on 2 L submental oxygen for comfort.  Labs notable for BNP of 103 and troponin of 0.63.  EKG demonstrated sinus rhythm with anteroseptal T wave inversions.  Chest x-ray showed no acute findings.  CTA chest showed bilateral segmental PE with extensive clot burden in the subsegmental pulmonary arteries.  Patient started on Lovenox while in the ED.  Critical care was involved and recommended admission to stepdown given concern for right heart strain.   Hospital Course:  1 acute submassive bilateral pulmonary emboli with right ventricular strain on CT scan Patient had presented with 3 to 4 days of chest pain and bilateral shoulder pain as well as shortness of breath on exertion and at rest and when laying flat.  Work-up in the ED was concerning for bilateral submassive PE with extensive clot burden in the subsegmental pulmonary arteries with right ventricular strain per CT scan.  Patient was placed on full dose Lovenox and pulmonary and critical care consultation obtained.  Patient was seen by pulmonary and critical care and as patient was noted to be hemodynamically stable with oxygen saturations on nasal cannula in the high 90s with no tachycardia or no tachypnea it was felt patient did not need catheter directed thrombolysis at this time.  It was felt that if patient decompensated that she will be a candidate for catheter directed thrombolysis.  Patient was admitted in the stepdown unit and monitored closely.  Patient remained hemodynamically stable  and improved on a daily basis.  Lower extremity Dopplers which were done were negative for DVT.  2D echo done with EF of 55 to 60%, no wall motion abnormalities, mildly dilated right ventricle, mild to moderate tricuspid valvular regurgitation, PA peak pressure of 60 mmHg.  Patient improved symptomatically and clinically during the hospitalization with improving  O2 requirements.  Patient was subsequently transitioned to Eliquis which she tolerated with no bleeding.  Patient underwent ambulatory pulse ox satting greater than 92% on ambulation on room air.  Patient denied any further chest pain or bilateral shoulder pain.  Patient will be discharged home in stable and improved condition and as patient has had 2 embolic events in the past will require lifelong anticoagulation.    2.  Hypothyroidism TSH at 3.805.    Patient maintained on home regimen of Synthroid.  Outpatient follow-up with PCP.   3.  Depression/anxiety Stable.    Maintained on home regimen of Lexapro.  4.  Overactive bladder Patient was maintained on home regimen of Myrbetriq.  5.  Hypokalemia Potassium was repleted during the hospitalization.  Patient will need repeat labs in the outpatient setting.  Procedures:  CT angiogram chest 02/15/2018  Chest x-ray 02/15/2018  Lower extremity Dopplers 02/16/2018  2D echo 02/16/2018   Consultations:  PCCM: Dr. Ander Slade 02/15/2018  Discharge Exam: Vitals:   02/18/18 0450 02/18/18 1036  BP: 128/64   Pulse: 64   Resp: 20   Temp: 97.9 F (36.6 C)   SpO2: 92% 98%    General: NAD Cardiovascular: RRR Respiratory: CTAB  Discharge Instructions   Discharge Instructions    Diet - low sodium heart healthy   Complete by:  As directed    Increase activity slowly   Complete by:  As directed      Allergies as of 02/18/2018      Reactions   Morphine And Related Hives   Chlorhexidine Itching   Possible reaction to chlorhexidine - Dr. Nelva Bush used to cleanse site prior to cortisone shot (pt not sure if this is correct name of cleanser) 06/15/15   Scopolamine Other (See Comments)   Jittery, hyperactivity      Medication List    STOP taking these medications   cephALEXin 500 MG capsule Commonly known as:  KEFLEX     TAKE these medications   apixaban 5 MG Tabs tablet Commonly known as:  ELIQUIS Take 1-2 tablets (5-10 mg  total) by mouth 2 (two) times daily. Take 2 tablets (10mg ) 2 times daily x 7 days, then 1 tablet (5mg ) 2 times daily.   escitalopram 20 MG tablet Commonly known as:  LEXAPRO Take 20 mg by mouth daily.   fluticasone 50 MCG/ACT nasal spray Commonly known as:  FLONASE Place 2 sprays into both nostrils daily.   ibuprofen 200 MG tablet Commonly known as:  ADVIL,MOTRIN Take 400-600 mg by mouth 2 (two) times daily as needed for moderate pain.   levothyroxine 75 MCG tablet Commonly known as:  SYNTHROID, LEVOTHROID Take 75 mcg by mouth daily before breakfast.   multivitamin with minerals Tabs tablet Take 1 tablet by mouth daily. Centrum Silver Women over 50   MYRBETRIQ 25 MG Tb24 tablet Generic drug:  mirabegron ER Take 25 mg by mouth daily.   polyethylene glycol packet Commonly known as:  MIRALAX / GLYCOLAX Take 17 g by mouth daily.   simvastatin 40 MG tablet Commonly known as:  ZOCOR Take 40 mg by mouth every evening.   traZODone 100 MG tablet  Commonly known as:  DESYREL Take 50-100 mg by mouth at bedtime as needed for sleep.      Allergies  Allergen Reactions  . Morphine And Related Hives  . Chlorhexidine Itching    Possible reaction to chlorhexidine - Dr. Nelva Bush used to cleanse site prior to cortisone shot (pt not sure if this is correct name of cleanser) 06/15/15  . Scopolamine Other (See Comments)    Jittery, hyperactivity   Follow-up Information    Jean Huddle, MD. Schedule an appointment as soon as possible for a visit in 1 week(s).   Specialty:  Internal Medicine Why:  f/u in 1-2 weeks. Contact information: [address] [place] [address] [phone]            The results of significant diagnostics from this hospitalization (including imaging, microbiology, ancillary and laboratory) are listed below for reference.    Significant Diagnostic Studies: Dg Chest 2 View  Result Date: 02/15/2018 CLINICAL DATA:  82 year old female with  shortness breath and mid back pain and left arm since last night. Initial encounter. EXAM: CHEST - 2 VIEW COMPARISON:  10/27/2017 chest x-ray. FINDINGS: Stable minimal peribronchial thickening. No infiltrate, congestive heart failure or pneumothorax. Heart size within normal limits. Calcified aorta. No acute osseous abnormality noted. IMPRESSION: 1. Chronic minimal peribronchial thickening. 2. No infiltrate or congestive heart failure. 3.  Aortic Atherosclerosis (ICD10-I70.0). Electronically Signed   By: Genia Del M.D.   On: 02/15/2018 18:12   Ct Angio Chest Pe W And/or Wo Contrast  Result Date: 02/15/2018 CLINICAL DATA:  Shortness of breath and chest pain EXAM: CT ANGIOGRAPHY CHEST WITH CONTRAST TECHNIQUE: Multidetector CT imaging of the chest was performed using the standard protocol during bolus administration of intravenous contrast. Multiplanar CT image reconstructions and MIPs were obtained to evaluate the vascular anatomy. CONTRAST:  110mL ISOVUE-370 IOPAMIDOL (ISOVUE-370) INJECTION 76% COMPARISON:  02/15/2018 radiograph, CT 06/15/2015 FINDINGS: Cardiovascular: Satisfactory opacification of the pulmonary arteries to the segmental level. Positive for bilateral acute pulmonary emboli with thrombus visualized in the distal right pulmonary artery and extending into right inter lobar pulmonary artery, and multiple segmental and subsegmental right upper, right middle and right lower lobe arteries. Thrombus within the distal left pulmonary artery, extending into segmental and subsegmental left upper and lower lobe pulmonary arteries. Positive for right heart strain with RV LV ratio of 2.1. Moderate aortic atherosclerosis. Nonaneurysmal aorta. Heart size within normal limits. No pericardial effusion Mediastinum/Nodes: Midline trachea. No thyroid mass. No significant mediastinal adenopathy. Esophagus within normal limits. Lungs/Pleura: Lungs are clear. No pleural effusion or pneumothorax. Upper Abdomen: No  acute abnormality. Musculoskeletal: No chest wall abnormality. No acute or significant osseous findings. Chronic superior endplate deformity at O13 Review of the MIP images confirms the above findings. IMPRESSION: Positive for acute bilateral right greater than left PE with CT evidence of right heart strain (RV/LV Ratio = 2.1) consistent with at least submassive (intermediate risk) PE. The presence of right heart strain has been associated with an increased risk of morbidity and mortality. Please activate Code PE by paging [phone]. Aortic Atherosclerosis (ICD10-I70.0). Critical Value/emergent results were called by telephone at the time of interpretation on 02/15/2018 at 7:49 pm to Dr. Margarita Mail, who verbally acknowledged these results. Electronically Signed   By: Donavan Foil M.D.   On: 02/15/2018 19:49    Microbiology: Recent Results (from the past 240 hour(s))  MRSA PCR Screening     Status: None   Collection Time: 02/15/18  9:28 PM  Result Value Ref Range Status   MRSA by PCR NEGATIVE NEGATIVE Final    Comment:        The GeneXpert MRSA Assay (FDA approved for NASAL specimens only), is one component of a comprehensive MRSA colonization surveillance program. It is not intended to diagnose MRSA infection nor to guide or monitor treatment for MRSA infections. Performed at [facility], [place] [address], [place], [place] [address]      Labs: Basic Metabolic Panel: Recent Labs  Lab 02/15/18 1800 02/16/18 0256 02/17/18 0754 02/18/18 0428  NA 143 144 143 145  K 3.3* 3.6 3.6 3.1*  CL 108 110 111 111  CO2 25 24 25 28   GLUCOSE 99 111* 122* 125*  BUN 13 12 15 16   CREATININE 1.07* 0.97 1.06* 1.16*  CALCIUM 9.0 9.0 8.7* 8.5*  MG  --  1.8 1.8 2.0  PHOS  --  3.3  --   --    Liver Function Tests: Recent Labs  Lab 02/15/18 1800  AST 21  ALT 11  ALKPHOS 76  BILITOT 0.8  PROT 6.8  ALBUMIN 3.8   Recent Labs  Lab 02/15/18 1800  LIPASE 36   No  results for input(s): AMMONIA in the last 168 hours. CBC: Recent Labs  Lab 02/15/18 1800 02/16/18 0256 02/17/18 0754 02/18/18 0428  WBC 7.4 8.1 6.1 7.2  NEUTROABS 4.9  --  3.2  --   HGB 12.1 11.5* 11.4* 11.0*  HCT 37.5 34.8* 35.3* 34.1*  MCV 94.5 93.3 93.9 94.5  PLT 222 197 210 204   Cardiac Enzymes: No results for input(s): CKTOTAL, CKMB, CKMBINDEX, TROPONINI in the last 168 hours. BNP: BNP (last 3 results) Recent Labs    02/15/18 1800  BNP 103.1*    ProBNP (last 3 results) No results for input(s): PROBNP in the last 8760 hours.  CBG: Recent Labs  Lab 02/16/18 0740 02/17/18 0809 02/18/18 0734  GLUCAP 104* 107* 114*       Signed:  Irine Seal MD.  [facility] 02/18/2018, 2:30 PM

## 2018-02-18 NOTE — Evaluation (Signed)
Occupational Therapy Evaluation Patient Details Name: Jean King MRN: [id number] DOB: 1936/03/24 Today's Date: 02/18/2018    History of Present Illness 82 y.o. 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  Pt admitted for acute submassive bilateral pulmonary emboli with right ventricular strain on CT scan   Clinical Impression   Pt overall at min guard assist level. Discussed energy conservation techniques and pacing activities. Pt doing well today. Pt would benefit from OT services to progress ADL independence and continue energy conservation education.     Follow Up Recommendations  Home health OT    Equipment Recommendations  None recommended by OT    Recommendations for Other Services       Precautions / Restrictions Precautions Precautions: Fall Precaution Comments: monitor sats Restrictions Weight Bearing Restrictions: No      Mobility Bed Mobility Overal bed mobility: Modified Independent                Transfers Overall transfer level: Needs assistance Equipment used: None Transfers: Sit to/from Stand Sit to Stand: Min guard         General transfer comment: for safety.    Balance                                           ADL either performed or assessed with clinical judgement   ADL Overall ADL's : Needs assistance/impaired Eating/Feeding: Independent;Bed level   Grooming: Wash/dry hands;Min guard;Standing   Upper Body Bathing: Set up;Sitting   Lower Body Bathing: Min guard;Sit to/from stand   Upper Body Dressing : Set up;Sitting   Lower Body Dressing: Min guard;Sit to/from stand   Toilet Transfer: Min guard   [place] and Hygiene: Min guard;Sit to/from stand   Tub/ Shower  Transfer: Tub transfer;Minimal assistance;Grab bars     General ADL Comments: Pt stating she has slight fatigue after self care activities but able to recover easily with short rest break at EOB after tasks. Discussed energy conservation techniques including pacing self, luke warm water for shower, sitting in kitchen to prepare meals, and breaking up tasks. Pt able to don underwear over feet while sitting on commode without difficulty. Practiced tub transfer and discussed using grab bars for safety and pt has a seat which she already uses. Also discussed sitting down for safety to don pants over feet. Pt's O2 sats at start of session 98% on RA and upon sitting back on bed were 95-98%.     Vision Patient Visual Report: No change from baseline       Perception     Praxis      Pertinent Vitals/Pain Pain Assessment: No/denies pain     Hand Dominance     Extremity/Trunk Assessment Upper Extremity Assessment Upper Extremity Assessment: Overall WFL for tasks assessed           Communication Communication Communication: HOH   Cognition Arousal/Alertness: Awake/alert Behavior During Therapy: WFL for tasks assessed/performed Overall Cognitive Status: Within Functional Limits for tasks assessed  General Comments       Exercises     Shoulder Instructions      Home Living Family/patient expects to be discharged to:: Private residence Living Arrangements: Alone   Type of Home: Apartment Home Access: Elevator     Home Layout: One level     Bathroom Shower/Tub: Teacher, early years/pre: Standard     Home Equipment: Environmental consultant - 2 wheels;Cane - single point;Grab bars - toilet;Grab bars - tub/shower;Toilet riser;Shower seat          Prior Functioning/Environment Level of Independence: Independent        Comments: typically ambulates without assistive device in apt however admittedly furniture walks - reports hx  of Meniere's. make simple meals, doesnt cook much. does own laundry.         OT Problem List: Decreased strength;Decreased activity tolerance;Decreased knowledge of use of DME or AE      OT Treatment/Interventions: Self-care/ADL training;DME and/or AE instruction;Therapeutic activities;Patient/family education    OT Goals(Current goals can be found in the care plan section) Acute Rehab OT Goals Patient Stated Goal: to return home. OT Goal Formulation: With patient Time For Goal Achievement: 03/04/18 Potential to Achieve Goals: Good  OT Frequency: Min 2X/week   Barriers to D/C:            Co-evaluation              AM-PAC PT "6 Clicks" Daily Activity     Outcome Measure Help from another person eating meals?: None Help from another person taking care of personal grooming?: A Little Help from another person toileting, which includes using toliet, bedpan, or urinal?: A Little Help from another person bathing (including washing, rinsing, drying)?: A Little Help from another person to put on and taking off regular upper body clothing?: None Help from another person to put on and taking off regular lower body clothing?: A Little 6 Click Score: 20   End of Session Equipment Utilized During Treatment: Rolling walker  Activity Tolerance: Patient tolerated treatment well Patient left: in bed;with call bell/phone within reach;with family/visitor present  OT Visit Diagnosis: Muscle weakness (generalized) (M62.81)                Time: 1027-2536 OT Time Calculation (min): 15 min Charges:  OT General Charges $OT Visit: 1 Visit OT Evaluation $OT Eval Low Complexity: 1 Low    Brianna Bennett S Jessieca Rhem OTR/L Acute Rehab [phone] 02/18/2018, 11:05 AM

## 2018-07-02 ENCOUNTER — Encounter: Payer: Self-pay | Admitting: Cardiology

## 2018-07-02 ENCOUNTER — Ambulatory Visit: Payer: Medicare Other | Admitting: Cardiology

## 2018-07-02 VITALS — BP 135/75 | HR 72 | Ht 63.0 in | Wt 207.0 lb

## 2018-07-02 DIAGNOSIS — Z86711 Personal history of pulmonary embolism: Secondary | ICD-10-CM

## 2018-07-02 DIAGNOSIS — E782 Mixed hyperlipidemia: Secondary | ICD-10-CM

## 2018-07-02 DIAGNOSIS — Z86718 Personal history of other venous thrombosis and embolism: Secondary | ICD-10-CM | POA: Diagnosis not present

## 2018-07-02 DIAGNOSIS — I209 Angina pectoris, unspecified: Secondary | ICD-10-CM | POA: Diagnosis not present

## 2018-07-02 DIAGNOSIS — R079 Chest pain, unspecified: Secondary | ICD-10-CM | POA: Insufficient documentation

## 2018-07-02 DIAGNOSIS — R0609 Other forms of dyspnea: Secondary | ICD-10-CM | POA: Diagnosis not present

## 2018-07-02 DIAGNOSIS — I10 Essential (primary) hypertension: Secondary | ICD-10-CM

## 2018-07-02 MED ORDER — METOPROLOL SUCCINATE ER 25 MG PO TB24: 12.5000 mg | ORAL_TABLET | Freq: Every day | ORAL | 2 refills | Status: DC

## 2018-07-02 NOTE — Progress Notes (Signed)
Subjective:   '@Patient'  ID@: Jean King, female    DOB: 22-Dec-1935, 83 y.o.   MRN: [id number]  Patient referred by Jean King for exertional chest pain and dyspnea on exertion.  Chief Complaint  Patient presents with  . Chest Pain    pt c/o chest pain for approx  months, which is now getting worse  . Shortness of Breath   Patient with hyperlipidemia, hypothyroidism, obesity, left knee DVT with bilateral pulmonary embolism in 2012 and recently in Oct 2019 referred to [place] for evaluation of exertional chest pain.    Patient has been having worsening chest pain for the last few months along with shortness of breath. Symptoms occur with walking from the store to her facility or even with climbing stairs. She also mentions feeling lightheaded with this. Symptoms improve with resting. Denies any PND, orthopnea, or leg edema. No hemoptysis. No syncope.   No black tarry stools with anticoagulation. She reports that she has not been evaluated by hematology for etiology as to why she has had reoccurrent DVT's. She does mention significant family history of DVT's. Reports her 55 year old grandson recently had CVA of unknown etiology.  Lives in [facility]. She does not exercise, but is hoping to start exercise regimen soon at her facility.   Past Medical History:  Diagnosis Date  . Cataract   . Chest pain   . Depression   . GERD (gastroesophageal reflux disease)   . Thyroid disease     Past Surgical History:  Procedure Laterality Date  . ABDOMINAL HYSTERECTOMY    . JOINT REPLACEMENT    . KNEE ARTHROSCOPY    . LIPOMA EXCISION    . NOSE SURGERY    . SPINE SURGERY      Family History  Problem Relation Age of Onset  . Cancer Mother   . Heart disease Father   . Stroke Father   . Heart attack Father   . Cancer Sister   . Heart disease Brother   . Stroke Grandson     Social History   Socioeconomic History  . Marital status: Widowed    Spouse  name: Not on file  . Number of children: Not on file  . Years of education: Not on file  . Highest education level: Not on file  Occupational History  . Not on file  Social Needs  . Financial resource strain: Not on file  . Food insecurity:    Worry: Not on file    Inability: Not on file  . Transportation needs:    Medical: Not on file    Non-medical: Not on file  Tobacco Use  . Smoking status: Never Smoker  . Smokeless tobacco: Never Used  Substance and Sexual Activity  . Alcohol use: No    Alcohol/week: 0.0 standard drinks  . Drug use: Never  . Sexual activity: Not on file  Lifestyle  . Physical activity:    Days per week: Not on file    Minutes per session: Not on file  . Stress: Not on file  Relationships  . Social connections:    Talks on phone: Not on file    Gets together: Not on file    Attends religious service: Not on file    Active member of club or organization: Not on file    Attends meetings of clubs or organizations: Not on file    Relationship status: Not on file  . Intimate partner violence:  Fear of current or ex partner: Not on file    Emotionally abused: Not on file    Physically abused: Not on file    Forced sexual activity: Not on file  Other Topics Concern  . Not on file  Social History Narrative  . Not on file    Current Outpatient Medications on File Prior to Visit  Medication Sig Dispense Refill  . acetaminophen (TYLENOL) 500 MG tablet Take 500 mg by mouth every 6 (six) hours as needed.    Marland Kitchen apixaban (ELIQUIS) 5 MG TABS tablet Take 1-2 tablets (5-10 mg total) by mouth 2 (two) times daily. Take 2 tablets (57m) 2 times daily x 7 days, then 1 tablet (572m 2 times daily. 70 tablet 0  . escitalopram (LEXAPRO) 20 MG tablet Take 20 mg by mouth daily.     . fluticasone (FLONASE) 50 MCG/ACT nasal spray Place 2 sprays into both nostrils daily.  5  . levothyroxine (SYNTHROID, LEVOTHROID) 75 MCG tablet Take 75 mcg by mouth daily before breakfast.   11  . Multiple Vitamin (MULTIVITAMIN WITH MINERALS) TABS tablet Take 1 tablet by mouth daily. Centrum Silver Women over 5073  . MYRBETRIQ 25 MG TB24 tablet Take 25 mg by mouth daily.  11  . naproxen sodium (ALEVE) 220 MG tablet Take 220 mg by mouth daily as needed.    . polyethylene glycol (MIRALAX / GLYCOLAX) packet Take 17 g by mouth daily.    . traZODone (DESYREL) 100 MG tablet Take 50-100 mg by mouth at bedtime as needed for sleep.  2  . simvastatin (ZOCOR) 40 MG tablet Take 40 mg by mouth every evening. On hold for a few months  2   No current facility-administered medications on file prior to visit.     Echocardiogram 02/20/2018: Left ventricle: The cavity size was normal. Systolic function was   normal. The estimated ejection fraction was in the range of 55%   to 60%. Wall motion was normal; there were no regional wall   motion abnormalities. Features are consistent with a pseudonormal   left ventricular filling pattern, with concomitant abnormal   relaxation and increased filling pressure (grade 2 diastolic   dysfunction). - Right ventricle: The cavity size was mildly dilated. Wall   thickness was normal. - Tricuspid valve: There was mild-moderate regurgitation directed   centrally. - Pulmonary arteries: Systolic pressure was moderately to severely   increased. PA peak pressure: 60 mm Hg (S).   Labs 06/17/2018: Creatinine 1.16, EGFR 45, potassium 4.0, BMP otherwise normal.  Hepatic panel normal.  TSH low at 0.25, T4 normal.  RBC 4.14, CBC otherwise normal.  Review of Systems  Constitution: Negative for decreased appetite, malaise/fatigue, weight gain and weight loss.  Eyes: Negative for visual disturbance.  Cardiovascular: Positive for chest pain (improves with resting) and dyspnea on exertion. Negative for claudication, leg swelling, orthopnea, palpitations and syncope.  Respiratory: Negative for hemoptysis and wheezing.   Endocrine: Negative for cold intolerance and heat  intolerance.  Hematologic/Lymphatic: Does not bruise/bleed easily.  Skin: Negative for nail changes.  Musculoskeletal: Negative for muscle weakness and myalgias.  Gastrointestinal: Negative for abdominal pain, change in bowel habit, nausea and vomiting.  Neurological: Negative for difficulty with concentration, dizziness, focal weakness and headaches.  Psychiatric/Behavioral: Negative for altered mental status and suicidal ideas.  All other systems reviewed and are negative.      Objective:   Physical Exam  Constitutional: She is oriented to person, place, and time. Vital signs  are normal. She appears well-developed and well-nourished.  HENT:  Head: Normocephalic and atraumatic.  Neck: Normal range of motion.  Cardiovascular: Normal rate, regular rhythm, normal heart sounds and intact distal pulses.  Pulmonary/Chest: Effort normal and breath sounds normal. No accessory muscle usage. No respiratory distress.  Abdominal: Soft. Bowel sounds are normal.  Musculoskeletal: Normal range of motion.  Neurological: She is alert and oriented to person, place, and time.  Skin: Skin is warm and dry.  Vitals reviewed.   EKG 07/02/2018: Normal sinus rhythm at 67 bpm with frequent PAC, normal axis, diffuse nonspecific T wave flattening.        Assessment & Recommendations:   1. Angina pectoris ([place]) Symptoms are concerning for angina. Does have risk factors for CAD. Physical exam is unremarkable. Will further evaluate with lexiscan nuclear stress test. She is unable to walk on the treadmill due to dyspnea on exertion. Will start her on Metoprolol succinate 12.5 mg daily for angina. Use nitroglycerin as needed.   2. Dyspnea on exertion Possibly anginal equivalent. No clinical evidence of heart failure. She did have mild RV strain noted on previous echocardiogram, secondary to PE at that time. Will schedule for echocardiogram for further evaluation.   3. History of recurrent deep vein thrombosis  (DVT) Currently on Eliquis. Given her recurrent DVT and family history, would recommend hematology evaluation for etiology, if not already performed.   4. History of pulmonary embolism On Eliquis. As stated above.   5. Essential hypertension Well controlled on current therapy. Metoprolol added for angina.  6. Mixed hyperlipidemia Unsure of her recent lipids. She is on pravastatin and she states is well controlled. Will request from PCP for further risk stratification.  I will see her back after the test.   *I have discussed this case with Dr. Virgina Jock and he personally examined the patient and participated in formulating the plan.*    Thank you for referring this patient. Please do not hesitate to contact me for any questions.   Jeri Lager, FNP-C [facility], [place] Office: [phone] Fax: [phone]

## 2018-07-04 ENCOUNTER — Encounter: Payer: Self-pay | Admitting: Cardiology

## 2018-07-04 DIAGNOSIS — R0609 Other forms of dyspnea: Secondary | ICD-10-CM | POA: Insufficient documentation

## 2018-07-04 DIAGNOSIS — Z86718 Personal history of other venous thrombosis and embolism: Secondary | ICD-10-CM | POA: Insufficient documentation

## 2018-07-16 ENCOUNTER — Other Ambulatory Visit: Payer: Self-pay

## 2018-07-16 ENCOUNTER — Ambulatory Visit: Payer: Medicare Other

## 2018-07-16 DIAGNOSIS — I209 Angina pectoris, unspecified: Secondary | ICD-10-CM

## 2018-07-16 DIAGNOSIS — I200 Unstable angina: Secondary | ICD-10-CM | POA: Diagnosis not present

## 2018-07-20 ENCOUNTER — Other Ambulatory Visit: Payer: Self-pay

## 2018-07-20 ENCOUNTER — Ambulatory Visit: Payer: Medicare Other

## 2018-07-20 DIAGNOSIS — R0609 Other forms of dyspnea: Secondary | ICD-10-CM

## 2018-08-03 ENCOUNTER — Other Ambulatory Visit: Payer: Self-pay

## 2018-08-03 ENCOUNTER — Ambulatory Visit (INDEPENDENT_AMBULATORY_CARE_PROVIDER_SITE_OTHER): Payer: Medicare Other | Admitting: Cardiology

## 2018-08-03 ENCOUNTER — Encounter: Payer: Self-pay | Admitting: Cardiology

## 2018-08-03 VITALS — BP 143/78 | HR 66 | Ht 63.0 in | Wt 201.0 lb

## 2018-08-03 DIAGNOSIS — I10 Essential (primary) hypertension: Secondary | ICD-10-CM

## 2018-08-03 DIAGNOSIS — Z86711 Personal history of pulmonary embolism: Secondary | ICD-10-CM

## 2018-08-03 DIAGNOSIS — Z86718 Personal history of other venous thrombosis and embolism: Secondary | ICD-10-CM

## 2018-08-03 DIAGNOSIS — R0609 Other forms of dyspnea: Secondary | ICD-10-CM

## 2018-08-03 DIAGNOSIS — I491 Atrial premature depolarization: Secondary | ICD-10-CM

## 2018-08-03 DIAGNOSIS — I209 Angina pectoris, unspecified: Secondary | ICD-10-CM

## 2018-08-03 NOTE — Progress Notes (Signed)
Subjective:   Patient: Jean King, female    DOB: 09-17-1935, 83 y.o.   MRN: [id number]  This visit type was conducted due to national recommendations for restrictions regarding the COVID-19 Pandemic (e.g. social distancing).  This format is felt to be most appropriate for this patient at this time.  All issues noted in this document were discussed and addressed.  No physical exam was performed (except for noted visual exam findings with Telehealth visits).  The patient has consented to conduct a Telehealth visit and understands insurance will be billed.   I discussed the limitations of evaluation and management by telemedicine and the availability of in person appointments. The patient expressed understanding and agreed to proceed.  Virtual Visit via Video Note is as below  I connected with Jean King on 08/03/18 at 1045 by a video enabled telemedicine application and verified that I am speaking with the correct person using two identifiers.     I have discussed with her regarding the safety during COVID Pandemic and steps and precautions including social distancing with the patient.    Chief Complaint  Patient presents with  . Results    nuc, echo  . Follow-up   HPI: Patient with hyperlipidemia, hypothyroidism, obesity, left knee DVT with bilateral pulmonary embolism in 2012 and recently in Oct 2019 with RV strain by echocardiogram recently evaluated by [place] for angina.   Due to concerning symptoms of angina with chest tightness and dyspnea on exertion that improved with resting, she underwent lexiscan nuclear stress testing on 07/16/2018 that was considered low risk study. Echocardiogram on 07/20/2018 showed normal LVEF, with grade 2 diastolic dysfunction, and moderate LVH. No RV strain. This is a follow up visit to discuss results. Daughter is present with her at patients room for the visit.  She has had mild chest tightness with exertion since last seen by [place], but has not been  able to be very active due to COVID 19 restrictions. She has mostly been confide to her room due to living in a facility. Has dyspnea on exertion that is stable. She was started on metoprolol succinate 12.1m daily at her last office visit.  No black tarry stools with anticoagulation. She reports that she has not been evaluated by hematology for etiology as to why she has had reoccurrent DVT's. She does mention significant family history of DVT's. Reports her 256year old grandson recently had CVA of unknown etiology.  [facility] She does not exercise, but is hoping to start exercise regimen soon at her facility.   Past Medical History:  Diagnosis Date  . Cataract   . Chest pain   . Depression   . GERD (gastroesophageal reflux disease)   . Thyroid disease     Past Surgical History:  Procedure Laterality Date  . ABDOMINAL HYSTERECTOMY    . JOINT REPLACEMENT    . KNEE ARTHROSCOPY    . LIPOMA EXCISION    . NOSE SURGERY    . SPINE SURGERY      Family History  Problem Relation Age of Onset  . Cancer Mother   . Heart disease Father   . Stroke Father   . Heart attack Father   . Cancer Sister   . Heart disease Brother   . Stroke Grandson     Social History   Socioeconomic History  . Marital status: Widowed    Spouse name: Not on file  . Number of children: 7  .  Years of education: Not on file  . Highest education level: Not on file  Occupational History  . Not on file  Social Needs  . Financial resource strain: Not on file  . Food insecurity:    Worry: Not on file    Inability: Not on file  . Transportation needs:    Medical: Not on file    Non-medical: Not on file  Tobacco Use  . Smoking status: Never Smoker  . Smokeless tobacco: Never Used  Substance and Sexual Activity  . Alcohol use: No    Alcohol/week: 0.0 standard drinks  . Drug use: Never  . Sexual activity: Not on file  Lifestyle  . Physical activity:    Days per  week: Not on file    Minutes per session: Not on file  . Stress: Not on file  Relationships  . Social connections:    Talks on phone: Not on file    Gets together: Not on file    Attends religious service: Not on file    Active member of club or organization: Not on file    Attends meetings of clubs or organizations: Not on file    Relationship status: Not on file  . Intimate partner violence:    Fear of current or ex partner: Not on file    Emotionally abused: Not on file    Physically abused: Not on file    Forced sexual activity: Not on file  Other Topics Concern  . Not on file  Social History Narrative  . Not on file    Current Outpatient Medications on File Prior to Visit  Medication Sig Dispense Refill  . acetaminophen (TYLENOL) 500 MG tablet Take 500 mg by mouth every 6 (six) hours as needed.    Marland Kitchen apixaban (ELIQUIS) 5 MG TABS tablet Take 1-2 tablets (5-10 mg total) by mouth 2 (two) times daily. Take 2 tablets (24m) 2 times daily x 7 days, then 1 tablet (584m 2 times daily. 70 tablet 0  . calcium acetate (PHOSLO) 667 MG capsule Take by mouth daily. [id number]    . escitalopram (LEXAPRO) 20 MG tablet Take 20 mg by mouth daily.     . fluticasone (FLONASE) 50 MCG/ACT nasal spray Place 2 sprays into both nostrils daily.  5  . levothyroxine (SYNTHROID, LEVOTHROID) 75 MCG tablet Take 75 mcg by mouth daily before breakfast.  11  . metoprolol succinate (TOPROL-XL) 25 MG 24 hr tablet Take 0.5 tablets (12.5 mg total) by mouth daily for 30 days. Take with or immediately following a meal. 30 tablet 2  . Multiple Vitamin (MULTIVITAMIN WITH MINERALS) TABS tablet Take 1 tablet by mouth daily. Centrum Silver Women over 50 33 . MYRBETRIQ 25 MG TB24 tablet Take 25 mg by mouth daily.  11  . naproxen sodium (ALEVE) 220 MG tablet Take 220 mg by mouth daily as needed.    . polyethylene glycol (MIRALAX / GLYCOLAX) packet Take 17 g by mouth daily as needed.     . traZODone (DESYREL) 100 MG  tablet Take 50-100 mg by mouth at bedtime as needed for sleep.  2  . simvastatin (ZOCOR) 40 MG tablet Take 40 mg by mouth every evening. On hold for a few months  2   No current facility-administered medications on file prior to visit.    Cardiac studies:   Lexiscan myoview stress test 07/16/2018:  1. Lexiscan stress test was performed. Exercise capacity was not assessed. Stress symptoms included dyspnea, dizziness.  Resting blood pressure was 134/86 mmHg and peak effect blood pressure was 118/68 mmHg. The resting and stress electrocardiogram demonstrated normal sinus rhythm, normal resting conduction, no arrhythmias and normal repolarization.  Stress EKG is non diagnostic for ischemia as it is a pharmacologic stress.  2. The overall quality of the study is good. There is no evidence of abnormal lung activity. Stress and rest SPECT images demonstrate homogeneous tracer distribution throughout the myocardium. Gated SPECT imaging reveals normal myocardial thickening and wall motion. The left ventricular ejection fraction was normal (65%).   3. Low risk study.  Echocardiogram 07/20/2018 :  Left ventricle cavity is normal in size. Moderate concentric hypertrophy of the left ventricle. Normal global wall motion. Visual EF is 50-55%. Doppler evidence of grade II (pseudonormal) diastolic dysfunction. Diastolic dysfunction findings suggests elevated LA/LV end diastolic pressure. Left atrial cavity is moderately dilated by volume visually. RV size and function were normal.  Echocardiogram 02/20/2018: Left ventricle: The cavity size was normal. Systolic function was   normal. The estimated ejection fraction was in the range of 55%   to 60%. Wall motion was normal; there were no regional wall   motion abnormalities. Features are consistent with a pseudonormal   left ventricular filling pattern, with concomitant abnormal   relaxation and increased filling pressure (grade 2 diastolic   dysfunction). - Right  ventricle: The cavity size was mildly dilated. Wall   thickness was normal. - Tricuspid valve: There was mild-moderate regurgitation directed   centrally. - Pulmonary arteries: Systolic pressure was moderately to severely   increased. PA peak pressure: 60 mm Hg (S).  Labs 06/17/2018: Creatinine 1.16, EGFR 45, potassium 4.0, BMP otherwise normal.  Hepatic panel normal.  TSH low at 0.25, T4 normal.  RBC 4.14, CBC otherwise normal.  Review of Systems  Constitution: Negative for decreased appetite, malaise/fatigue, weight gain and weight loss.  Eyes: Negative for visual disturbance.  Cardiovascular: Positive for chest pain (improves with resting; mild) and dyspnea on exertion. Negative for claudication, leg swelling, orthopnea, palpitations and syncope.  Respiratory: Negative for hemoptysis and wheezing.   Endocrine: Negative for cold intolerance and heat intolerance.  Hematologic/Lymphatic: Does not bruise/bleed easily.  Skin: Negative for nail changes.  Musculoskeletal: Negative for muscle weakness and myalgias.  Gastrointestinal: Negative for abdominal pain, change in bowel habit, nausea and vomiting.  Neurological: Negative for difficulty with concentration, dizziness, focal weakness and headaches.  Psychiatric/Behavioral: Negative for altered mental status and suicidal ideas.  All other systems reviewed and are negative.      Objective:   Physical Exam  Constitutional: She is oriented to person, place, and time. She appears well-developed and well-nourished. No distress.  Pulmonary/Chest: Effort normal. No respiratory distress.  Neurological: She is alert and oriented to person, place, and time.  Psychiatric: She has a normal mood and affect. Her behavior is normal.  Vitals reviewed.   EKG 07/02/2018: Normal sinus rhythm at 67 bpm with frequent PAC, normal axis, diffuse nonspecific T wave flattening.      Assessment & Recommendations:   1. Angina pectoris ([place]) I have  discussed recently obtained stress test results with the patient, no evidence of ischemia by perfusion imaging. Her symptoms continue to be concerning for this; however, she has not noticed any recent symptoms. She has not been active recently due to [place] restrictions. Will continue with current medications. Encouraged her to try to start walking daily if able or doing exercises in her room to evaluate her symptoms. If she continues to have  symptoms, can consider further evaluation.   2. Dyspnea on exertion I have discussed echocardiogram results, does have grade 2 diastolic dysfunction. This along with deconditioning are possibly contributing to her symptoms. As stated above, will start regular exercise. She does not appear to be in any distress and I cannot see any JVD by video visit. She denies any leg swelling. Advised her to be careful with her diet and to avoid salt. She reports losing 16 lbs since January.   3. History of recurrent deep vein thrombosis (DVT) If she has not had hematology evaluation, would consider this. Continue with Eliquis.   4. History of pulmonary embolism On eliquis. Previously noted RV strain has resolved by recent echocardiogram.   5. Essential hypertension Well controlled.   6. Premature atrial contraction Noted to have frequent PAC's on EKG in our office. She feels that this has improved since being on metoprolol. Will continue the same.   Plan: I will plan to see her back in 3 months for follow up on her symptoms. She is encouraged to contact me for any worsening symptoms or concerns.   Jeri Lager, FNP-C [facility], [place] Office: [phone] Fax: [phone]

## 2018-11-02 ENCOUNTER — Other Ambulatory Visit: Payer: Self-pay

## 2018-11-02 ENCOUNTER — Encounter: Payer: Self-pay | Admitting: Cardiology

## 2018-11-02 ENCOUNTER — Ambulatory Visit (INDEPENDENT_AMBULATORY_CARE_PROVIDER_SITE_OTHER): Payer: Medicare Other | Admitting: Cardiology

## 2018-11-02 VITALS — BP 148/71 | HR 57 | Ht 63.0 in | Wt 198.0 lb

## 2018-11-02 DIAGNOSIS — R0609 Other forms of dyspnea: Secondary | ICD-10-CM | POA: Diagnosis not present

## 2018-11-02 DIAGNOSIS — I491 Atrial premature depolarization: Secondary | ICD-10-CM

## 2018-11-02 DIAGNOSIS — E782 Mixed hyperlipidemia: Secondary | ICD-10-CM

## 2018-11-02 DIAGNOSIS — I5032 Chronic diastolic (congestive) heart failure: Secondary | ICD-10-CM | POA: Diagnosis not present

## 2018-11-02 DIAGNOSIS — I10 Essential (primary) hypertension: Secondary | ICD-10-CM | POA: Diagnosis not present

## 2018-11-02 MED ORDER — LOSARTAN POTASSIUM 25 MG PO TABS: 25.0000 mg | ORAL_TABLET | Freq: Every day | ORAL | 1 refills | Status: DC

## 2018-11-02 NOTE — Progress Notes (Signed)
Primary Physician:  Josetta Huddle, MD   Patient ID: Jean King, female    DOB: 1936/03/02, 83 y.o.   MRN: [id number]  Subjective:    Chief Complaint  Patient presents with  . Chest Pain    3 month  . Shortness of Breath   This visit type was conducted due to national recommendations for restrictions regarding the COVID-19 Pandemic (e.g. social distancing).  This format is felt to be most appropriate for this patient at this time.  All issues noted in this document were discussed and addressed.  No physical exam was performed (except for noted visual exam findings with Telehealth visits).  The patient has consented to conduct a Telehealth visit and understands insurance will be billed.   I discussed the limitations of evaluation and management by telemedicine and the availability of in person appointments. The patient expressed understanding and agreed to proceed.  Virtual Visit via Video Note is as below  I connected with Ms. Aye, on 11/02/18 at 1130 by a video enabled telemedicine application and verified that I am speaking with the correct person using two identifiers.     I have discussed with her regarding the safety during COVID Pandemic and steps and precautions including social distancing with the patient.    HPI: Jean King  is a 83 y.o. female  with hyperlipidemia, hypothyroidism, obesity, left knee DVT with bilateral pulmonary embolism in 2012 and recently in Oct 2019 with RV strain by echocardiogram recently evaluated by [place] for angina.   Due to concerning symptoms of angina with chest tightness and dyspnea on exertion that improved with resting, she underwent lexiscan nuclear stress testing on 07/16/2018 that was considered low risk study. Echocardiogram on 07/20/2018 showed normal LVEF, with grade 2 diastolic dysfunction, and moderate LVH. No RV strain. Patient continued to have occasional mild chest tightness; however, had not been very active due to COVID  restrictions. I had encouraged patient to start regular exercise to see if her symptoms would improve. This is a 3 month follow up.   She states that her dyspnea on exertion has remained stable and is only present with over exertion. She has not noticed any chest pain or discomfort. States palpitations have improved since being on metoprolol.   No black tarry stools with anticoagulation. She reports that she has not been evaluated by hematology for etiology as to why she has had reoccurrent DVT's. She does mention significant family history of DVT's. Reports her 57 year old grandson recently had CVA of unknown etiology.  Lives in [facility] living facility. She does not exercise due to COVID at this time. She has been working to lose weight and has lost 20 lbs since January.   Past Medical History:  Diagnosis Date  . Cataract   . Chest pain   . Depression   . GERD (gastroesophageal reflux disease)   . Thyroid disease     Past Surgical History:  Procedure Laterality Date  . ABDOMINAL HYSTERECTOMY    . JOINT REPLACEMENT    . KNEE ARTHROSCOPY    . LIPOMA EXCISION    . NOSE SURGERY    . SPINE SURGERY      Social History   Socioeconomic History  . Marital status: Widowed    Spouse name: Not on file  . Number of children: 7  . Years of education: Not on file  . Highest education level: Not on file  Occupational History  . Not on file  Social  Needs  . Financial resource strain: Not on file  . Food insecurity    Worry: Not on file    Inability: Not on file  . Transportation needs    Medical: Not on file    Non-medical: Not on file  Tobacco Use  . Smoking status: Never Smoker  . Smokeless tobacco: Never Used  Substance and Sexual Activity  . Alcohol use: No    Alcohol/week: 0.0 standard drinks  . Drug use: Never  . Sexual activity: Not on file  Lifestyle  . Physical activity    Days per week: Not on file    Minutes per session: Not on file  . Stress: Not  on file  Relationships  . Social Herbalist on phone: Not on file    Gets together: Not on file    Attends religious service: Not on file    Active member of club or organization: Not on file    Attends meetings of clubs or organizations: Not on file    Relationship status: Not on file  . Intimate partner violence    Fear of current or ex partner: Not on file    Emotionally abused: Not on file    Physically abused: Not on file    Forced sexual activity: Not on file  Other Topics Concern  . Not on file  Social History Narrative  . Not on file    Review of Systems  Constitution: Negative for decreased appetite, malaise/fatigue, weight gain and weight loss.  Eyes: Negative for visual disturbance.  Cardiovascular: Positive for chest pain (improved only with over exertion) and dyspnea on exertion. Negative for claudication, leg swelling, orthopnea, palpitations and syncope.  Respiratory: Negative for hemoptysis and wheezing.   Endocrine: Negative for cold intolerance and heat intolerance.  Hematologic/Lymphatic: Does not bruise/bleed easily.  Skin: Negative for nail changes.  Musculoskeletal: Negative for muscle weakness and myalgias.  Gastrointestinal: Negative for abdominal pain, change in bowel habit, nausea and vomiting.  Neurological: Negative for difficulty with concentration, dizziness, focal weakness and headaches.  Psychiatric/Behavioral: Negative for altered mental status and suicidal ideas.  All other systems reviewed and are negative.     Objective:  Blood pressure (!) 148/71, pulse (!) 57, height '5\' 3"'  (1.6 m), weight 198 lb (89.8 kg). Body mass index is 35.07 kg/m.    Physical exam not performed or limited due to virtual visit.  Patient appeared to be in no distress, Neck was supple, respiration was not labored.  Please see exam details from prior visit is as below.   Physical Exam  Constitutional: She is oriented to person, place, and time. Vital signs  are normal. She appears well-developed and well-nourished.  HENT:  Head: Normocephalic and atraumatic.  Neck: Normal range of motion.  Cardiovascular: Normal rate, regular rhythm, normal heart sounds and intact distal pulses.  Pulmonary/Chest: Effort normal and breath sounds normal. No accessory muscle usage. No respiratory distress.  Abdominal: Soft. Bowel sounds are normal.  Musculoskeletal: Normal range of motion.  Neurological: She is alert and oriented to person, place, and time.  Skin: Skin is warm and dry.  Vitals reviewed.  Radiology: No results found.  Laboratory examination:   Labs 06/17/2018: Creatinine 1.16, EGFR 45, potassium 4.0, BMP otherwise normal.  Hepatic panel normal.  TSH low at 0.25, T4 normal.  RBC 4.14, CBC otherwise normal.  CMP Latest Ref Rng & Units 02/18/2018 02/17/2018 02/16/2018  Glucose 70 - 99 mg/dL 125(H) 122(H) 111(H)  BUN 8 -  23 mg/dL '16 15 12  ' Creatinine 0.44 - 1.00 mg/dL 1.16(H) 1.06(H) 0.97  Sodium 135 - 145 mmol/L 145 143 144  Potassium 3.5 - 5.1 mmol/L 3.1(L) 3.6 3.6  Chloride 98 - 111 mmol/L 111 111 110  CO2 22 - 32 mmol/L '28 25 24  ' Calcium 8.9 - 10.3 mg/dL 8.5(L) 8.7(L) 9.0  Total Protein 6.5 - 8.1 g/dL - - -  Total Bilirubin 0.3 - 1.2 mg/dL - - -  Alkaline Phos 38 - 126 U/L - - -  AST 15 - 41 U/L - - -  ALT 0 - 44 U/L - - -   CBC Latest Ref Rng & Units 02/18/2018 02/17/2018 02/16/2018  WBC 4.0 - 10.5 K/uL 7.2 6.1 8.1  Hemoglobin 12.0 - 15.0 g/dL 11.0(L) 11.4(L) 11.5(L)  Hematocrit 36.0 - 46.0 % 34.1(L) 35.3(L) 34.8(L)  Platelets 150 - 400 K/uL 204 210 197   Lipid Panel  No results found for: CHOL, TRIG, HDL, CHOLHDL, VLDL, LDLCALC, LDLDIRECT HEMOGLOBIN A1C No results found for: HGBA1C, MPG TSH Recent Labs    02/16/18 0256  TSH 3.805    PRN Meds:. Medications Discontinued During This Encounter  Medication Reason  . calcium acetate (PHOSLO) 667 MG capsule Error   Current Meds  Medication Sig  . acetaminophen (TYLENOL)  500 MG tablet Take 500 mg by mouth every 6 (six) hours as needed.  Marland Kitchen apixaban (ELIQUIS) 5 MG TABS tablet Take 1-2 tablets (5-10 mg total) by mouth 2 (two) times daily. Take 2 tablets (17m) 2 times daily x 7 days, then 1 tablet (560m 2 times daily. (Patient taking differently: Take 5-10 mg by mouth 2 (two) times daily. )  . calcium-vitamin D (OSCAL WITH D) 500-200 MG-UNIT tablet Take 1 tablet by mouth.  . escitalopram (LEXAPRO) 20 MG tablet Take 20 mg by mouth daily.   . fluticasone (FLONASE) 50 MCG/ACT nasal spray Place 2 sprays into both nostrils daily.  . Marland Kitchenevothyroxine (SYNTHROID, LEVOTHROID) 75 MCG tablet Take 75 mcg by mouth daily before breakfast.  . metoprolol succinate (TOPROL-XL) 25 MG 24 hr tablet Take 0.5 tablets (12.5 mg total) by mouth daily for 30 days. Take with or immediately following a meal.  . Multiple Vitamin (MULTIVITAMIN WITH MINERALS) TABS tablet Take 1 tablet by mouth daily. Centrum Silver Women over 5092. MYRBETRIQ 25 MG TB24 tablet Take 25 mg by mouth daily.  . naproxen sodium (ALEVE) 220 MG tablet Take 220 mg by mouth daily as needed.  . polyethylene glycol (MIRALAX / GLYCOLAX) packet Take 17 g by mouth daily as needed.   . traZODone (DESYREL) 100 MG tablet Take 50-100 mg by mouth at bedtime as needed for sleep.    Cardiac Studies:   Lexiscan myoview stress test 07/16/2018:  1. Lexiscan stress test was performed. Exercise capacity was not assessed. Stress symptoms included dyspnea, dizziness. Resting blood pressure was 134/86 mmHg and peak effect blood pressure was 118/68 mmHg. The resting and stress electrocardiogram demonstrated normal sinus rhythm, normal resting conduction, no arrhythmias and normal repolarization. Stress EKG is non diagnostic for ischemia as it is a pharmacologic stress.  2. The overall quality of the study is good. There is no evidence of abnormal lung activity. Stress and rest SPECT images demonstrate homogeneous tracer distribution throughout the  myocardium. Gated SPECT imaging reveals normal myocardial thickening and wall motion. The left ventricular ejection fraction was normal (65%).  3. Low risk study.  Echocardiogram 07/20/2018 : Left ventricle cavity is normal in size. Moderate concentric  hypertrophy of the left ventricle. Normal global wall motion. Visual EF is 50-55%. Doppler evidence of grade II (pseudonormal) diastolic dysfunction. Diastolic dysfunction findings suggests elevated LA/LV end diastolic pressure. Left atrial cavity is moderately dilated by volume visually. RV size and function were normal.   Assessment:     ICD-10-CM   1. Chronic diastolic heart failure (HCC)  I50.32   2. Dyspnea on exertion  R06.09   3. Essential hypertension  M60 Basic metabolic panel    Basic metabolic panel  4. Mixed hyperlipidemia  E78.2   5. Premature atrial contraction  I49.1      EKG 07/02/2018: Normal sinus rhythm at 67 bpm with frequent PAC, normal axis, diffuse nonspecific T wave flattening.   Recommendations:   Patient has not had any exertional chest pain since last seen by me. She has remained active and has stable dyspnea on exertion. I suspect dyspnea on exertion is related to diastolic dysfunction as well as her weight and deconditioning. I have encouraged her to try to walk daily. She has had negative nuclear stress test. Palpitations have improved with Metoprolol and will continue the same.   Blood pressure is elevated today and has been elevated on her last few visits. She reports history of hypotension several years ago that led to several falls. I will try low dose of Losartan 25 mg daily both for HTN and diastolic dysfunction. Will obtain BMP in 2 weeks for surveillance of kidney function. She will continue with home monitoring.   Hyperlipidemia is managed by PCP, patient reports is stable with pravastatin. She is on Eliquis for recurrent DVT, no bleeding diathesis. I will see her back in 3 months for follow up on  hypertension, diastolic CHF, and dyspnea on exertion.   Miquel Dunn, MSN, APRN, FNP-C [facility]. [place] Office: [phone] Fax: [phone]

## 2018-11-04 LAB — BASIC METABOLIC PANEL
BUN/Creatinine Ratio: 23 (ref 12–28)
BUN: 23 mg/dL (ref 8–27)
CO2: 25 mmol/L (ref 20–29)
Calcium: 9.4 mg/dL (ref 8.7–10.3)
Chloride: 104 mmol/L (ref 96–106)
Creatinine, Ser: 1.02 mg/dL — ABNORMAL HIGH (ref 0.57–1.00)
GFR calc Af Amer: 60 mL/min/{1.73_m2} (ref >59)
GFR calc non Af Amer: 52 mL/min/{1.73_m2} — ABNORMAL LOW (ref >59)
Glucose: 159 mg/dL — ABNORMAL HIGH (ref 65–99)
Potassium: 4.8 mmol/L (ref 3.5–5.2)
Sodium: 143 mmol/L (ref 134–144)

## 2018-11-24 ENCOUNTER — Other Ambulatory Visit: Payer: Self-pay | Admitting: Cardiology

## 2018-11-24 NOTE — Telephone Encounter (Signed)
Please fill

## 2018-12-22 ENCOUNTER — Other Ambulatory Visit: Payer: Self-pay | Admitting: Cardiology

## 2018-12-22 DIAGNOSIS — I209 Angina pectoris, unspecified: Secondary | ICD-10-CM

## 2018-12-26 ENCOUNTER — Other Ambulatory Visit: Payer: Self-pay | Admitting: Cardiology

## 2019-01-20 ENCOUNTER — Other Ambulatory Visit: Payer: Self-pay | Admitting: Cardiology

## 2019-02-01 ENCOUNTER — Ambulatory Visit: Payer: Medicare Other | Admitting: Cardiology

## 2019-02-01 ENCOUNTER — Encounter: Payer: Self-pay | Admitting: Cardiology

## 2019-02-01 ENCOUNTER — Other Ambulatory Visit: Payer: Self-pay

## 2019-02-01 VITALS — BP 138/68 | HR 60 | Temp 95.9°F | Ht 63.0 in | Wt 200.0 lb

## 2019-02-01 DIAGNOSIS — I5032 Chronic diastolic (congestive) heart failure: Secondary | ICD-10-CM | POA: Diagnosis not present

## 2019-02-01 DIAGNOSIS — R0609 Other forms of dyspnea: Secondary | ICD-10-CM | POA: Diagnosis not present

## 2019-02-01 DIAGNOSIS — I10 Essential (primary) hypertension: Secondary | ICD-10-CM

## 2019-02-01 DIAGNOSIS — E782 Mixed hyperlipidemia: Secondary | ICD-10-CM | POA: Diagnosis not present

## 2019-02-01 NOTE — Progress Notes (Signed)
Primary Physician:  Josetta Huddle, MD   Patient ID: Jean King, female    DOB: 20-Jan-1936, 83 y.o.   MRN: [id number]  Subjective:    Chief Complaint  Patient presents with  . Hypertension  . Congestive Heart Failure  . Follow-up    HPI: ANJU SERENO  is a 83 y.o. female  with hyperlipidemia, hypothyroidism, obesity, left knee DVT with bilateral pulmonary embolism in 2012 and recently in Oct 2019 with RV strain by echocardiogram recently evaluated by [place] for angina.   Due to concerning symptoms of angina with chest tightness and dyspnea on exertion that improved with resting, she underwent lexiscan nuclear stress testing on 07/16/2018 that was considered low risk study. Echocardiogram on 07/20/2018 showed normal LVEF, with grade 2 diastolic dysfunction, and moderate LVH. No RV strain. Patient continued to have occasional mild chest tightness; however, had not been very active due to COVID restrictions. I had encouraged patient to start regular exercise to see if her symptoms would improve. This is a 3 month follow up.   She reports a fall while bending over that made her dizzy about 1 week ago. No syncope, but did injure her back during the fall. She continues to have some occasional dizziness with sudden position changes. No recurrence of falls or syncope. She was started on Losartan at her last office visit, tolerating this well and blood pressure has been much better controlled. She does have a few low readings on her home BP monitor.  She states that her dyspnea on exertion has remained stable and is only present with over exertion. She has not noticed any chest pain or discomfort. States palpitations have improved since being on metoprolol.   No black tarry stools with anticoagulation. She reports that she has not been evaluated by hematology for etiology as to why she has had reoccurrent DVT's. She does mention significant family history of DVT's. Reports her 95 year old grandson  recently had CVA of unknown etiology.  Lives in [facility] living facility. She does not exercise due to COVID at this time. She has been working to lose weight and has lost 20 lbs since January.   Past Medical History:  Diagnosis Date  . Cataract   . Chest pain   . Depression   . GERD (gastroesophageal reflux disease)   . HTN (hypertension) 06/23/2010  . Thyroid disease     Past Surgical History:  Procedure Laterality Date  . ABDOMINAL HYSTERECTOMY    . JOINT REPLACEMENT    . KNEE ARTHROSCOPY    . LIPOMA EXCISION    . NOSE SURGERY    . SPINE SURGERY      Social History   Socioeconomic History  . Marital status: Widowed    Spouse name: Not on file  . Number of children: 7  . Years of education: Not on file  . Highest education level: Not on file  Occupational History  . Not on file  Social Needs  . Financial resource strain: Not on file  . Food insecurity    Worry: Not on file    Inability: Not on file  . Transportation needs    Medical: Not on file    Non-medical: Not on file  Tobacco Use  . Smoking status: Never Smoker  . Smokeless tobacco: Never Used  Substance and Sexual Activity  . Alcohol use: No    Alcohol/week: 0.0 standard drinks  . Drug use: Never  . Sexual activity: Not on file  Lifestyle  . Physical activity    Days per week: Not on file    Minutes per session: Not on file  . Stress: Not on file  Relationships  . Social Herbalist on phone: Not on file    Gets together: Not on file    Attends religious service: Not on file    Active member of club or organization: Not on file    Attends meetings of clubs or organizations: Not on file    Relationship status: Not on file  . Intimate partner violence    Fear of current or ex partner: Not on file    Emotionally abused: Not on file    Physically abused: Not on file    Forced sexual activity: Not on file  Other Topics Concern  . Not on file  Social History Narrative   . Not on file    Review of Systems  Constitution: Negative for decreased appetite, malaise/fatigue, weight gain and weight loss.  Eyes: Negative for visual disturbance.  Cardiovascular: Positive for dyspnea on exertion. Negative for chest pain (improved only with over exertion), claudication, leg swelling, orthopnea, palpitations and syncope.  Respiratory: Negative for hemoptysis and wheezing.   Endocrine: Negative for cold intolerance and heat intolerance.  Hematologic/Lymphatic: Does not bruise/bleed easily.  Skin: Negative for nail changes.  Musculoskeletal: Negative for muscle weakness and myalgias.  Gastrointestinal: Negative for abdominal pain, change in bowel habit, nausea and vomiting.  Neurological: Negative for difficulty with concentration, dizziness, focal weakness and headaches.  Psychiatric/Behavioral: Negative for altered mental status and suicidal ideas.  All other systems reviewed and are negative.     Objective:  Blood pressure 138/68, pulse 60, temperature (!) 95.9 F (35.5 C), height '5\' 3"'  (1.6 m), weight 200 lb (90.7 kg), SpO2 98 %. Body mass index is 35.43 kg/m.     Physical Exam  Constitutional: She is oriented to person, place, and time. Vital signs are normal. She appears well-developed and well-nourished.  HENT:  Head: Normocephalic and atraumatic.  Neck: Normal range of motion.  Cardiovascular: Normal rate, regular rhythm, normal heart sounds and intact distal pulses.  Pulmonary/Chest: Effort normal and breath sounds normal. No accessory muscle usage. No respiratory distress.  Abdominal: Soft. Bowel sounds are normal.  Musculoskeletal: Normal range of motion.  Neurological: She is alert and oriented to person, place, and time.  Skin: Skin is warm and dry.  Vitals reviewed.  Radiology: No results found.  Laboratory examination:   Labs 06/17/2018: Creatinine 1.16, EGFR 45, potassium 4.0, BMP otherwise normal.  Hepatic panel normal.  TSH low at 0.25,  T4 normal.  RBC 4.14, CBC otherwise normal.  CMP Latest Ref Rng & Units 11/03/2018 02/18/2018 02/17/2018  Glucose 65 - 99 mg/dL 159(H) 125(H) 122(H)  BUN 8 - 27 mg/dL '23 16 15  ' Creatinine 0.57 - 1.00 mg/dL 1.02(H) 1.16(H) 1.06(H)  Sodium 134 - 144 mmol/L 143 145 143  Potassium 3.5 - 5.2 mmol/L 4.8 3.1(L) 3.6  Chloride 96 - 106 mmol/L 104 111 111  CO2 20 - 29 mmol/L '25 28 25  ' Calcium 8.7 - 10.3 mg/dL 9.4 8.5(L) 8.7(L)  Total Protein 6.5 - 8.1 g/dL - - -  Total Bilirubin 0.3 - 1.2 mg/dL - - -  Alkaline Phos 38 - 126 U/L - - -  AST 15 - 41 U/L - - -  ALT 0 - 44 U/L - - -   CBC Latest Ref Rng & Units 02/18/2018 02/17/2018 02/16/2018  WBC  4.0 - 10.5 K/uL 7.2 6.1 8.1  Hemoglobin 12.0 - 15.0 g/dL 11.0(L) 11.4(L) 11.5(L)  Hematocrit 36.0 - 46.0 % 34.1(L) 35.3(L) 34.8(L)  Platelets 150 - 400 K/uL 204 210 197   Lipid Panel  No results found for: CHOL, TRIG, HDL, CHOLHDL, VLDL, LDLCALC, LDLDIRECT HEMOGLOBIN A1C No results found for: HGBA1C, MPG TSH Recent Labs    02/16/18 0256  TSH 3.805    PRN Meds:. There are no discontinued medications. Current Meds  Medication Sig  . acetaminophen (TYLENOL) 500 MG tablet Take 500 mg by mouth every 6 (six) hours as needed.  Marland Kitchen apixaban (ELIQUIS) 5 MG TABS tablet Take 1-2 tablets (5-10 mg total) by mouth 2 (two) times daily. Take 2 tablets (57m) 2 times daily x 7 days, then 1 tablet (560m 2 times daily. (Patient taking differently: Take 5-10 mg by mouth 2 (two) times daily. )  . calcium-vitamin D (OSCAL WITH D) 500-200 MG-UNIT tablet Take 1 tablet by mouth.  . escitalopram (LEXAPRO) 20 MG tablet Take 20 mg by mouth daily.   . fluticasone (FLONASE) 50 MCG/ACT nasal spray Place 2 sprays into both nostrils daily.  . Marland Kitchenevothyroxine (SYNTHROID, LEVOTHROID) 75 MCG tablet Take 75 mcg by mouth daily before breakfast.  . losartan (COZAAR) 25 MG tablet TAKE 1 TABLET BY MOUTH EVERY DAY  . metoprolol succinate (TOPROL-XL) 25 MG 24 hr tablet TAKE 1/2 TABLET BY  MOUTH DAILY X 30 DAYS WITH OR IMMEDIATELY FOLLOWING A MEAL  . Multiple Vitamin (MULTIVITAMIN WITH MINERALS) TABS tablet Take 1 tablet by mouth daily. Centrum Silver Women over 5068. MYRBETRIQ 25 MG TB24 tablet Take 25 mg by mouth daily.  . naproxen sodium (ALEVE) 220 MG tablet Take 220 mg by mouth daily as needed.  . polyethylene glycol (MIRALAX / GLYCOLAX) packet Take 17 g by mouth daily as needed.   . simvastatin (ZOCOR) 40 MG tablet Take 40 mg by mouth every evening. On hold for a few months  . traZODone (DESYREL) 100 MG tablet Take 50-100 mg by mouth at bedtime as needed for sleep.    Cardiac Studies:   Lexiscan myoview stress test 07/16/2018:  1. Lexiscan stress test was performed. Exercise capacity was not assessed. Stress symptoms included dyspnea, dizziness. Resting blood pressure was 134/86 mmHg and peak effect blood pressure was 118/68 mmHg. The resting and stress electrocardiogram demonstrated normal sinus rhythm, normal resting conduction, no arrhythmias and normal repolarization. Stress EKG is non diagnostic for ischemia as it is a pharmacologic stress.  2. The overall quality of the study is good. There is no evidence of abnormal lung activity. Stress and rest SPECT images demonstrate homogeneous tracer distribution throughout the myocardium. Gated SPECT imaging reveals normal myocardial thickening and wall motion. The left ventricular ejection fraction was normal (65%).  3. Low risk study.  Echocardiogram 07/20/2018 : Left ventricle cavity is normal in size. Moderate concentric hypertrophy of the left ventricle. Normal global wall motion. Visual EF is 50-55%. Doppler evidence of grade II (pseudonormal) diastolic dysfunction. Diastolic dysfunction findings suggests elevated LA/LV end diastolic pressure. Left atrial cavity is moderately dilated by volume visually. RV size and function were normal.   Assessment:     ICD-10-CM   1. Chronic diastolic heart failure (HCC)  I50.32    2. Essential hypertension  I10 EKG 12-Lead  3. Dyspnea on exertion  R06.09   4. Mixed hyperlipidemia  E78.2    EKG 02/01/2019: Sinus rhythm at 61 bpm, normal axis, diffuse non-specific T wave flattening.  Recommendations:   Blood pressure has significantly improved since being on losartan, kidney function has remained stable.  She did have a few readings of low blood pressure, encouraged her to continue to monitor this.  Suspect this may have contributed to her dizziness and fall.  I have advised her to ensure that she is staying well-hydrated.  Encouraged slow position changes to avoid any dizziness.  Her episodes appear to be episodic and again likely related to soft blood pressure.  Advised her to notify me if she continues to have frequent low blood pressure readings and will make adjustments at that time, but overall her readings have been stable.  Her symptoms of palpitations have resolved since being on metoprolol.  She previously had frequent PACs, these have also resolved.  She has not noticed any chest discomfort on exertion, but has not been quite as active as she once was.  I have advised her to start some regular exercise.  She has stable chronic dyspnea on exertion related to diastolic dysfunction and likely deconditioning, hopefully this will improve with increased activity level.  Hyperlipidemia is managed by PCP, patient reports is stable with pravastatin. She is on Eliquis for recurrent DVT, no bleeding diathesis. I will see her back in 6 months for follow up on hypertension, diastolic CHF, and dyspnea on exertion.   Miquel Dunn, MSN, APRN, FNP-C [facility]. [place] Office: [phone] Fax: [phone]

## 2019-02-03 ENCOUNTER — Encounter: Payer: Self-pay | Admitting: Cardiology

## 2019-02-12 ENCOUNTER — Other Ambulatory Visit: Payer: Self-pay | Admitting: Cardiology

## 2019-03-29 ENCOUNTER — Other Ambulatory Visit: Payer: Self-pay

## 2019-03-29 DIAGNOSIS — I209 Angina pectoris, unspecified: Secondary | ICD-10-CM

## 2019-03-29 MED ORDER — METOPROLOL SUCCINATE ER 25 MG PO TB24: 12.5000 mg | ORAL_TABLET | Freq: Every day | ORAL | 2 refills | Status: DC

## 2019-05-25 ENCOUNTER — Other Ambulatory Visit: Payer: Self-pay

## 2019-05-25 DIAGNOSIS — I209 Angina pectoris, unspecified: Secondary | ICD-10-CM

## 2019-05-25 MED ORDER — METOPROLOL SUCCINATE ER 25 MG PO TB24: 12.5000 mg | ORAL_TABLET | Freq: Every day | ORAL | 3 refills | Status: DC

## 2019-06-06 ENCOUNTER — Ambulatory Visit: Payer: Medicare Other

## 2019-06-12 ENCOUNTER — Ambulatory Visit: Payer: Medicare Other | Attending: Internal Medicine

## 2019-06-12 DIAGNOSIS — Z23 Encounter for immunization: Secondary | ICD-10-CM

## 2019-06-12 NOTE — Progress Notes (Signed)
   Covid-19 Vaccination Clinic  Name:  Jean King    MRN: [id number] DOB: [birth year]  06/12/2019  [name] was observed post Covid-19 immunization for 15 minutes without incidence. She was provided with Vaccine Information Sheet and instruction to access the V-Safe system.   [name] was instructed to call 911 with any severe reactions post vaccine: Marland Kitchen Difficulty breathing  . Swelling of your face and throat  . A fast heartbeat  . A bad rash all over your body  . Dizziness and weakness    Immunizations Administered    Name Date Dose VIS Date Route   Pfizer COVID-19 Vaccine 06/12/2019 11:26 AM 0.3 mL 04/15/2019 Intramuscular   Manufacturer: [employer]   Lot: [id number]   [place]: [id number]

## 2019-07-06 ENCOUNTER — Ambulatory Visit: Payer: Medicare Other | Attending: Internal Medicine

## 2019-07-06 ENCOUNTER — Ambulatory Visit: Payer: Medicare Other

## 2019-07-06 DIAGNOSIS — Z23 Encounter for immunization: Secondary | ICD-10-CM | POA: Insufficient documentation

## 2019-07-06 NOTE — Progress Notes (Signed)
   Covid-19 Vaccination Clinic  Name:  TARHONDA LANDRY    MRN: [id number] DOB: [birth year]  07/06/2019  Ms. Peary was observed post Covid-19 immunization for 15 minutes without incident. She was provided with Vaccine Information Sheet and instruction to access the V-Safe system.   Ms. Fendt was instructed to call 911 with any severe reactions post vaccine: Marland Kitchen Difficulty breathing  . Swelling of face and throat  . A fast heartbeat  . A bad rash all over body  . Dizziness and weakness   Immunizations Administered    Name Date Dose VIS Date Route   Pfizer COVID-19 Vaccine 07/06/2019 10:30 AM 0.3 mL 04/15/2019 Intramuscular   Manufacturer: [place]   Lot: [id number]   [place]: [id number]

## 2019-08-01 ENCOUNTER — Other Ambulatory Visit: Payer: Self-pay

## 2019-08-01 ENCOUNTER — Encounter: Payer: Self-pay | Admitting: Cardiology

## 2019-08-01 ENCOUNTER — Ambulatory Visit: Payer: Medicare Other | Admitting: Cardiology

## 2019-08-01 VITALS — BP 127/54 | HR 69 | Temp 96.0°F | Resp 18 | Ht 63.0 in | Wt 210.0 lb

## 2019-08-01 DIAGNOSIS — I10 Essential (primary) hypertension: Secondary | ICD-10-CM

## 2019-08-01 DIAGNOSIS — I5189 Other ill-defined heart diseases: Secondary | ICD-10-CM

## 2019-08-01 DIAGNOSIS — Z86711 Personal history of pulmonary embolism: Secondary | ICD-10-CM

## 2019-08-01 DIAGNOSIS — E782 Mixed hyperlipidemia: Secondary | ICD-10-CM

## 2019-08-01 DIAGNOSIS — R0609 Other forms of dyspnea: Secondary | ICD-10-CM

## 2019-08-01 MED ORDER — FUROSEMIDE 20 MG PO TABS: 20.0000 mg | ORAL_TABLET | Freq: Every day | ORAL | 3 refills | Status: DC

## 2019-08-01 NOTE — Patient Instructions (Signed)

## 2019-08-01 NOTE — Progress Notes (Signed)
Follow up visit  Subjective:   Jean King, female    DOB: 22-Feb-1936, 84 y.o.   MRN: [id number]   HPI   Chief Complaint  Patient presents with  . Hypertension  . Congestive Heart Failure  . Follow-up    66 month    84 year old African-American female with hypertension,  hyperlipidemia, hypothyroidism, history of recurrent DVT/PE, HFpEF.  Patient reports exertional dyspnea with minimal activity.  She denies any orthopnea or PND.  On specific questioning, she endorses that her diet comprises of canned soups, canned tuna, peanut butter jelly sandwich etc.  She is unable to cook for herself due to her exertional dyspnea symptoms.  Her daughter has been asking her to move in with her so she can help her with meals.  Patient is thinking about this.    Current Outpatient Medications on File Prior to Visit  Medication Sig Dispense Refill  . acetaminophen (TYLENOL) 500 MG tablet Take 500 mg by mouth every 6 (six) hours as needed.    Marland Kitchen apixaban (ELIQUIS) 5 MG TABS tablet Take 1-2 tablets (5-10 mg total) by mouth 2 (two) times daily. Take 2 tablets (39m) 2 times daily x 7 days, then 1 tablet (532m 2 times daily. (Patient taking differently: Take 5-10 mg by mouth 2 (two) times daily. ) 70 tablet 0  . calcium-vitamin D (OSCAL WITH D) 500-200 MG-UNIT tablet Take 1 tablet by mouth.    . escitalopram (LEXAPRO) 20 MG tablet Take 20 mg by mouth daily.     . fluticasone (FLONASE) 50 MCG/ACT nasal spray Place 2 sprays into both nostrils daily.  5  . levothyroxine (SYNTHROID, LEVOTHROID) 75 MCG tablet Take 75 mcg by mouth daily before breakfast.  11  . losartan (COZAAR) 25 MG tablet TAKE 1 TABLET BY MOUTH EVERY DAY 90 tablet 1  . metoprolol succinate (TOPROL-XL) 25 MG 24 hr tablet Take 0.5 tablets (12.5 mg total) by mouth daily. 30 tablet 3  . Multiple Vitamin (MULTIVITAMIN WITH MINERALS) TABS tablet Take 1 tablet by mouth daily. Centrum Silver Women over [id number]  . MYRBETRIQ 25 MG TB24 tablet Take 25  mg by mouth daily.  11  . nitroGLYCERIN (NITROSTAT) 0.4 MG SL tablet nitroglycerin 0.4 mg sublingual tablet  DISSOLVE 1 TABLET UNDER THE TONGUE EVERY 5 MINUTES AS NEEDED FOR CHEST PAIN. DO NOT EXCEED A TOTAL OF 3 DOSES IN 15 MINUTES.    . Marland Kitchenolyethylene glycol (MIRALAX / GLYCOLAX) packet Take 17 g by mouth daily as needed.     . simvastatin (ZOCOR) 40 MG tablet Take 40 mg by mouth every evening. On hold for a few months  2  . traZODone (DESYREL) 100 MG tablet Take 50-100 mg by mouth at bedtime as needed for sleep.  2  . naproxen sodium (ALEVE) 220 MG tablet Take 220 mg by mouth daily as needed.     No current facility-administered medications on file prior to visit.    Cardiovascular & other pertient studies:  EKG 08/01/2019: Sinus rhythm 59 bpm. Normal EKG.   Echocardiogram 07/20/2018 :  Left ventricle cavity is normal in size. Moderate concentric hypertrophy  of the left ventricle. Normal global wall motion. Visual EF is 50-55%.  Doppler evidence of grade II (pseudonormal) diastolic dysfunction.  Diastolic dysfunction findings suggests elevated LA/LV end diastolic  pressure.  Left atrial cavity is moderately dilated by volume visually.  Exercise myoview stress 07/16/2018:  1. Lexiscan stress test was performed. Exercise capacity was not assessed. Stress symptoms  included dyspnea, dizziness. Resting blood pressure was 134/86 mmHg and peak effect blood pressure was 118/68 mmHg. The resting and stress electrocardiogram demonstrated normal sinus rhythm, normal resting conduction, no arrhythmias and normal repolarization.  Stress EKG is non diagnostic for ischemia as it is a pharmacologic stress.  2. The overall quality of the study is good. There is no evidence of abnormal lung activity. Stress and rest SPECT images demonstrate homogeneous tracer distribution throughout the myocardium. Gated SPECT imaging reveals normal myocardial thickening and wall motion. The left ventricular ejection  fraction was normal (65%).   3. Low risk study.   Recent labs: 06/27/2019: Glucose 90, BUN/Cr 22/1.1. EGFR 52 Chol 170, TG 228, HDL 53, LDL 79 TSH 1.3 normal    Review of Systems  Cardiovascular: Positive for dyspnea on exertion. Negative for chest pain, leg swelling, palpitations and syncope.         Vitals:   08/01/19 1033  BP: (!) 127/54  Pulse: 69  Resp: 18  Temp: (!) 96 F (35.6 C)  SpO2: 96%     Body mass index is 37.2 kg/m. Filed Weights   08/01/19 1033  Weight: 210 lb (95.3 kg)     Objective:   Physical Exam  Constitutional: She appears well-developed and well-nourished.  Neck: No JVD present.  Cardiovascular: Normal rate, regular rhythm, normal heart sounds and intact distal pulses.  No murmur heard. Pulmonary/Chest: Effort normal and breath sounds normal. She has no wheezes. She has no rales.  Musculoskeletal:        General: Edema (Trace b/l) present.  Nursing note and vitals reviewed.         Assessment & Recommendations:   83 year old African-American female with hypertension,  hyperlipidemia, hypothyroidism, history of recurrent DVT/PE, HFpEF.  HFpEF: Grade 2 diastolic dysfunction seen on echocardiogram in March 2020.  Counseled patient regarding low-salt diet, particularly avoiding canned or frozen meals.  Encouraged her to consider moving in with her daughter.  I started her on Lasix 20 mg daily.  Also referred for remote patient monitoring to check daily weights.  Follow-up in 3 months with Jeri Lager, FNP.   Nigel Mormon, MD [facility] Pager: [phone] Office: [phone]

## 2019-09-13 NOTE — Progress Notes (Signed)
Follow up visit  Subjective:   Jean King, female    DOB: 05-11-1935, 84 y.o.   MRN: [id number]   HPI   Chief Complaint  Patient presents with  . Opthalmic migraine  . Congestive Heart Failure    84 year old Caucasian female with hypertension,  hyperlipidemia, hypothyroidism, history of recurrent DVT/PE, HFpEF.  Patient is re-referred today for consideration of carotid US, given her recent complaint of ophthalmic migraines. Notes not available to me for review.   Previous OV on 08/01/19: Patient reports exertional dyspnea with minimal activity.  She denies any orthopnea or PND.  On specific questioning, she endorses that her diet comprises of canned soups, canned tuna, peanut butter jelly sandwich etc.  She is unable to cook for herself due to her exertional dyspnea symptoms.  Her daughter has been asking her to move in with her so she can help her with meals.  Patient is thinking about this.    Current Outpatient Medications on File Prior to Visit  Medication Sig Dispense Refill  . acetaminophen (TYLENOL) 500 MG tablet Take 500 mg by mouth every 6 (six) hours as needed.    Marland Kitchen apixaban (ELIQUIS) 5 MG TABS tablet Take 1-2 tablets (5-10 mg total) by mouth 2 (two) times daily. Take 2 tablets (27m) 2 times daily x 7 days, then 1 tablet (559m 2 times daily. (Patient taking differently: Take 5-10 mg by mouth 2 (two) times daily. ) 70 tablet 0  . calcium-vitamin D (OSCAL WITH D) 500-200 MG-UNIT tablet Take 1 tablet by mouth.    . escitalopram (LEXAPRO) 20 MG tablet Take 20 mg by mouth daily.     . fluticasone (FLONASE) 50 MCG/ACT nasal spray Place 2 sprays into both nostrils daily.  5  . furosemide (LASIX) 20 MG tablet Take 1 tablet (20 mg total) by mouth daily. 30 tablet 3  . levothyroxine (SYNTHROID, LEVOTHROID) 75 MCG tablet Take 75 mcg by mouth daily before breakfast.  11  . losartan (COZAAR) 25 MG tablet TAKE 1 TABLET BY MOUTH EVERY DAY 90 tablet 1  . metoprolol succinate  (TOPROL-XL) 25 MG 24 hr tablet Take 0.5 tablets (12.5 mg total) by mouth daily. 30 tablet 3  . Multiple Vitamin (MULTIVITAMIN WITH MINERALS) TABS tablet Take 1 tablet by mouth daily. Centrum Silver Women over [id number]  . MYRBETRIQ 25 MG TB24 tablet Take 25 mg by mouth daily.  11  . nitroGLYCERIN (NITROSTAT) 0.4 MG SL tablet nitroglycerin 0.4 mg sublingual tablet  DISSOLVE 1 TABLET UNDER THE TONGUE EVERY 5 MINUTES AS NEEDED FOR CHEST PAIN. DO NOT EXCEED A TOTAL OF 3 DOSES IN 15 MINUTES.    . Marland Kitchenolyethylene glycol (MIRALAX / GLYCOLAX) packet Take 17 g by mouth daily as needed.     . simvastatin (ZOCOR) 40 MG tablet Take 40 mg by mouth every evening. On hold for a few months  2  . traZODone (DESYREL) 100 MG tablet Take 50-100 mg by mouth at bedtime as needed for sleep.  2   No current facility-administered medications on file prior to visit.    Cardiovascular & other pertient studies:  EKG 08/01/2019: Sinus rhythm 59 bpm. Normal EKG.  Echocardiogram 07/20/2018 :  Left ventricle cavity is normal in size. Moderate concentric hypertrophy  of the left ventricle. Normal global wall motion. Visual EF is 50-55%.  Doppler evidence of grade II (pseudonormal) diastolic dysfunction.  Diastolic dysfunction findings suggests elevated LA/LV end diastolic  pressure.  Left atrial cavity is moderately dilated  by volume visually.  Exercise myoview stress 07/16/2018:  1. Lexiscan stress test was performed. Exercise capacity was not assessed. Stress symptoms included dyspnea, dizziness. Resting blood pressure was 134/86 mmHg and peak effect blood pressure was 118/68 mmHg. The resting and stress electrocardiogram demonstrated normal sinus rhythm, normal resting conduction, no arrhythmias and normal repolarization.  Stress EKG is non diagnostic for ischemia as it is a pharmacologic stress.  2. The overall quality of the study is good. There is no evidence of abnormal lung activity. Stress and rest SPECT images  demonstrate homogeneous tracer distribution throughout the myocardium. Gated SPECT imaging reveals normal myocardial thickening and wall motion. The left ventricular ejection fraction was normal (65%).   3. Low risk study.   Recent labs: 06/27/2019: Glucose 90, BUN/Cr 22/1.1. EGFR 52 Chol 170, TG 228, HDL 53, LDL 79 TSH 1.3 normal    Review of Systems  Cardiovascular: Positive for dyspnea on exertion (Mild, chronic, stable). Negative for chest pain, leg swelling, palpitations and syncope.         Vitals:   09/14/19 1021  BP: 114/83  Pulse: 80  SpO2: 97%     Body mass index is 36.85 kg/m. Filed Weights   09/14/19 1021  Weight: 208 lb (94.3 kg)     Objective:   Physical Exam  Constitutional: She appears well-developed and well-nourished.  Neck: No JVD present.  Cardiovascular: Normal rate, regular rhythm, normal heart sounds and intact distal pulses.  No murmur heard. Pulmonary/Chest: Effort normal and breath sounds normal. She has no wheezes. She has no rales.  Musculoskeletal:        General: No edema.  Nursing note and vitals reviewed.         Assessment & Recommendations:   84 year old African-American female with hypertension,  hyperlipidemia, hypothyroidism, history of recurrent DVT/PE, HFpEF, now with ophthalmic migraine.  Ophthalmic migraine: Concern for cardioembolic source. Will obtain carotid duplex US. Lipids well controlled.  HFpEF: Grade 2 diastolic dysfunction seen on echocardiogram in March 2020.  Continue lasix.  F/u in 6 months.   Jean Mormon, MD [facility] Pager: [phone] Office: [phone]

## 2019-09-14 ENCOUNTER — Encounter: Payer: Self-pay | Admitting: Cardiology

## 2019-09-14 ENCOUNTER — Ambulatory Visit: Payer: Medicare Other | Admitting: Cardiology

## 2019-09-14 ENCOUNTER — Other Ambulatory Visit: Payer: Self-pay

## 2019-09-14 VITALS — BP 114/83 | HR 80 | Ht 63.0 in | Wt 208.0 lb

## 2019-09-14 DIAGNOSIS — G43109 Migraine with aura, not intractable, without status migrainosus: Secondary | ICD-10-CM

## 2019-09-16 ENCOUNTER — Ambulatory Visit: Payer: Medicare Other

## 2019-09-16 ENCOUNTER — Other Ambulatory Visit: Payer: Self-pay

## 2019-09-16 DIAGNOSIS — G43109 Migraine with aura, not intractable, without status migrainosus: Secondary | ICD-10-CM

## 2019-09-20 ENCOUNTER — Other Ambulatory Visit: Payer: Self-pay | Admitting: Cardiology

## 2019-09-20 DIAGNOSIS — I6522 Occlusion and stenosis of left carotid artery: Secondary | ICD-10-CM

## 2019-11-03 ENCOUNTER — Ambulatory Visit: Payer: Medicare Other | Admitting: Cardiology

## 2019-11-13 ENCOUNTER — Other Ambulatory Visit: Payer: Self-pay | Admitting: Cardiology

## 2019-11-13 DIAGNOSIS — R0609 Other forms of dyspnea: Secondary | ICD-10-CM

## 2019-11-13 DIAGNOSIS — I5189 Other ill-defined heart diseases: Secondary | ICD-10-CM

## 2019-11-30 ENCOUNTER — Encounter: Payer: Self-pay | Admitting: Cardiology

## 2019-11-30 ENCOUNTER — Other Ambulatory Visit: Payer: Self-pay

## 2019-11-30 ENCOUNTER — Ambulatory Visit: Payer: Medicare Other | Admitting: Cardiology

## 2019-11-30 VITALS — BP 110/86 | HR 61 | Ht 63.0 in | Wt 215.0 lb

## 2019-11-30 DIAGNOSIS — Z86711 Personal history of pulmonary embolism: Secondary | ICD-10-CM

## 2019-11-30 DIAGNOSIS — I5032 Chronic diastolic (congestive) heart failure: Secondary | ICD-10-CM

## 2019-11-30 DIAGNOSIS — I10 Essential (primary) hypertension: Secondary | ICD-10-CM

## 2019-11-30 DIAGNOSIS — E782 Mixed hyperlipidemia: Secondary | ICD-10-CM

## 2019-11-30 DIAGNOSIS — I6522 Occlusion and stenosis of left carotid artery: Secondary | ICD-10-CM

## 2019-11-30 DIAGNOSIS — Z712 Person consulting for explanation of examination or test findings: Secondary | ICD-10-CM

## 2019-11-30 NOTE — Progress Notes (Signed)
Subjective:   Jean King, female    DOB: 24-Aug-1935, 84 y.o.   MRN: [id number]  Chief Complaint  Patient presents with  . Shortness of Breath  . Follow-up    86 month   84 year old Caucasian female with hypertension,  hyperlipidemia, hypothyroidism, history of recurrent DVT/PE, HFpEF who presents to the office with a chief complaint of 7-monthfollow-up of shortness of breath review test results.  She is an established patient of Dr. MVernell Leepbut is here to see me since he has office week.  Patient was recently seen in the office back in May 2021 and had been diagnosed with ophthalmic migraines and therefore was referred for carotid duplex.  Patient had a carotid duplex since last visit and she is noted to have atherosclerosis of the left carotid artery suggestive of stenosis of less than 50%.  The findings were discussed with her in great detail at today's office visit.  Patient is already on oral anticoagulation given her history of DVT and PE.  And she is also on statin therapy.  She has a prior history of heart failure with preserved EF.  She is currently euvolemic.  No recent hospitalizations or urgent care visits for heart failure symptoms.  She has gained approximately 7 pound since last visit but this is most secondary to noncompliance with diet.  Patient states that she consumes a significant line of ice cream and cookies and other food items that she knows of just should not be eating.  Patient is compliant with her medications which were reconciled at today's visit.   Current Outpatient Medications on File Prior to Visit  Medication Sig Dispense Refill  . acetaminophen (TYLENOL) 500 MG tablet Take 500 mg by mouth every 6 (six) hours as needed.    .Marland Kitchenapixaban (ELIQUIS) 5 MG TABS tablet Take 1-2 tablets (5-10 mg total) by mouth 2 (two) times daily. Take 2 tablets (163m 2 times daily x 7 days, then 1 tablet (57m39m2 times daily. (Patient taking differently: Take 5 mg by mouth  2 (two) times daily. Take 2 tablets (41m63m times daily x 7 days, then 1 tablet (57mg)74mtimes daily.) 70 tablet 0  . calcium-vitamin D (OSCAL WITH D) 500-200 MG-UNIT tablet Take 1 tablet by mouth.    . diclofenac (VOLTAREN) 50 MG EC tablet Take 50 mg by mouth 2 (two) times daily.    . escMarland Kitchentalopram (LEXAPRO) 20 MG tablet Take 20 mg by mouth daily.     . fluticasone (FLONASE) 50 MCG/ACT nasal spray Place 2 sprays into both nostrils daily.  5  . furosemide (LASIX) 20 MG tablet TAKE ONE TABLET BY MOUTH EVERY MORNING 30 tablet 3  . levothyroxine (SYNTHROID, LEVOTHROID) 75 MCG tablet Take 75 mcg by mouth daily before breakfast.  11  . losartan (COZAAR) 25 MG tablet TAKE 1 TABLET BY MOUTH EVERY DAY 90 tablet 1  . metoprolol succinate (TOPROL-XL) 25 MG 24 hr tablet Take 0.5 tablets (12.5 mg total) by mouth daily. 30 tablet 3  . MYRBETRIQ 25 MG TB24 tablet Take 25 mg by mouth daily.  11  . nitroGLYCERIN (NITROSTAT) 0.4 MG SL tablet nitroglycerin 0.4 mg sublingual tablet  DISSOLVE 1 TABLET UNDER THE TONGUE EVERY 5 MINUTES AS NEEDED FOR CHEST PAIN. DO NOT EXCEED A TOTAL OF 3 DOSES IN 15 MINUTES.    . polMarland Kitchenethylene glycol (MIRALAX / GLYCOLAX) packet Take 17 g by mouth daily as needed.     . simvastatin (ZOCOR)  40 MG tablet Take 40 mg by mouth every evening. On hold for a few months  2  . traMADol (ULTRAM) 50 MG tablet Take 50 mg by mouth every 6 (six) hours as needed.    . traZODone (DESYREL) 100 MG tablet Take 50-100 mg by mouth at bedtime as needed for sleep.  2   No current facility-administered medications on file prior to visit.    Cardiovascular & other pertient studies:  EKG 08/01/2019: Sinus rhythm 59 bpm. Normal EKG.  Echocardiogram 07/20/2018 :  Left ventricle cavity is normal in size. Moderate concentric hypertrophy  of the left ventricle. Normal global wall motion. Visual EF is 50-55%.  Doppler evidence of grade II (pseudonormal) diastolic dysfunction.  Diastolic dysfunction findings  suggests elevated LA/LV end diastolic  pressure.  Left atrial cavity is moderately dilated by volume visually.  Exercise myoview stress 07/16/2018:  1. Lexiscan stress test was performed. Exercise capacity was not assessed. Stress symptoms included dyspnea, dizziness. Resting blood pressure was 134/86 mmHg and peak effect blood pressure was 118/68 mmHg. The resting and stress electrocardiogram demonstrated normal sinus rhythm, normal resting conduction, no arrhythmias and normal repolarization.  Stress EKG is non diagnostic for ischemia as it is a pharmacologic stress.  2. The overall quality of the study is good. There is no evidence of abnormal lung activity. Stress and rest SPECT images demonstrate homogeneous tracer distribution throughout the myocardium. Gated SPECT imaging reveals normal myocardial thickening and wall motion. The left ventricular ejection fraction was normal (65%).   3. Low risk study.   Carotid artery duplex 09/16/2019:  No evidence of significant stenosis in the right carotid vessels.  Stenosis in the left internal carotid artery (16-49%) with homogeneous plaque.  Antegrade right vertebral artery flow. Antegrade left vertebral artery flow.  Follow up in one year is appropriate if clinically indicated.  Recent labs: 06/27/2019: Glucose 90, BUN/Cr 22/1.1. EGFR 52 Chol 170, TG 228, HDL 53, LDL 79 TSH 1.3 normal  Review of Systems  Constitutional: Positive for weight gain.  Cardiovascular: Positive for dyspnea on exertion (Mild, chronic, stable). Negative for chest pain, leg swelling, palpitations and syncope.  Respiratory: Positive for shortness of breath.    Objective:     PHYSICAL EXAM: Vitals with BMI 11/30/2019 09/14/2019 08/01/2019  Height _0  _1  _2   Weight 215 lbs 208 lbs 210 lbs  BMI 38.09 79.02 40.97  Systolic 353 299 242  Diastolic 86 83 54  Pulse 61 80 69   CONSTITUTIONAL: Well-developed and well-nourished. No acute distress.  SKIN: Skin is  warm and dry. No rash noted. No cyanosis. No pallor. No jaundice HEAD: Normocephalic and atraumatic.  EYES: No scleral icterus MOUTH/THROAT: Moist oral membranes.  NECK: No JVD present. No thyromegaly noted. No carotid bruits  LYMPHATIC: No visible cervical adenopathy.  CHEST Normal respiratory effort. No intercostal retractions  LUNGS: Clear to auscultation bilaterally. No stridor. No wheezes. No rales.  CARDIOVASCULAR: Regular rate and rhythm, positive S1-S2, no murmurs rubs or gallops appreciated. ABDOMINAL: Obese, soft, nontender, nondistended, bowel also all 4 quadrants.  No apparent ascites.  EXTREMITIES: Trace peripheral edema left lower extremity.  She had an ankle brace as well. HEMATOLOGIC: No significant bruising NEUROLOGIC: Oriented to person, place, and time. Nonfocal. Normal muscle tone.  PSYCHIATRIC: Normal mood and affect. Normal behavior. Cooperative   Assessment & Recommendations:   84 year old female with hypertension,  hyperlipidemia, hypothyroidism, history of recurrent DVT/PE, HFpEF, Left sided carotid artery stenosis, ophthalmic migraine.    ICD-10-CM  1. Chronic diastolic heart failure (HCC)  I50.32   2. Asymptomatic stenosis of left carotid artery  I65.22   3. History of pulmonary embolus (PE)  Z86.711   4. Essential hypertension  I10   5. Mixed hyperlipidemia  E78.2   6. Encounter to discuss test results  Z71.2    Ophthalmic migraine: Stable Concern for cardioembolic source.  Last visit recommended carotid duplex. Results reviewed with her.  Lipids managed by per PCP.  Continue statin.   HFpEF: Grade 2 diastolic dysfunction seen on echocardiogram in March 2020.  Continue current medications. Weight has gone up by 7 pounds over two months. Patient is attributing to poor diet (I.e. ice-cream and cookies etc.).  Ejection Fraction noted on last 2D Echo Recommend daily weight check, strict I/O's Fluid restriction to <2L per day, Na restriction < 2g per  day Continue guideline directed medical therapy.  Left sided carotid artery stenosis:  Continue statin.  Repeat study in year to re-evaluate disease bruden/progression.  Findings discussed with patient at today's office visit.   Keep original appt with Dr. Earnie Larsson for Dec 2021.    Rex Kras, [place], [facility]  Pager: [phone] Office: [phone]

## 2019-12-29 ENCOUNTER — Ambulatory Visit: Payer: Medicare Other | Admitting: Cardiology

## 2019-12-29 ENCOUNTER — Ambulatory Visit: Payer: Medicare Other

## 2019-12-29 ENCOUNTER — Encounter: Payer: Self-pay | Admitting: Cardiology

## 2019-12-29 ENCOUNTER — Other Ambulatory Visit: Payer: Self-pay

## 2019-12-29 VITALS — BP 111/39 | HR 63 | Resp 17 | Ht 63.0 in | Wt 211.0 lb

## 2019-12-29 DIAGNOSIS — R55 Syncope and collapse: Secondary | ICD-10-CM | POA: Insufficient documentation

## 2019-12-29 DIAGNOSIS — I5032 Chronic diastolic (congestive) heart failure: Secondary | ICD-10-CM | POA: Insufficient documentation

## 2019-12-29 MED ORDER — FUROSEMIDE 20 MG PO TABS: 20.0000 mg | ORAL_TABLET | ORAL | 3 refills | Status: DC | PRN

## 2019-12-29 NOTE — Progress Notes (Signed)
Follow up visit  Subjective:   Jean King, female    DOB: 12-30-1935, 84 y.o.   MRN: [id number]   HPI   Chief Complaint  Patient presents with  . Dizziness  . Congestive Heart Failure  . Follow-up    84 year old Caucasian female with hypertension,  hyperlipidemia, hypothyroidism, history of recurrent DVT/PE, HFpEF, ophthalmic migraine.  Patient is here with her daughter today.  She has had at least two episodes of loss of consciousness.  Typically, these episodes occur when patient is standing, looking up, trying to reach to get something.  This is followed by dizziness, spinning sensation, and loss of consciousness for undetermined period of time, as patient lives with herself.  She denies any chest pain, shortness of respiratory episodes.  Prior cardiac work-up, as detailed below.    Current Outpatient Medications on File Prior to Visit  Medication Sig Dispense Refill  . acetaminophen (TYLENOL) 500 MG tablet Take 500 mg by mouth every 6 (six) hours as needed.    Marland Kitchen apixaban (ELIQUIS) 5 MG TABS tablet Take 1-2 tablets (5-10 mg total) by mouth 2 (two) times daily. Take 2 tablets (60m) 2 times daily x 7 days, then 1 tablet (550m 2 times daily. (Patient taking differently: Take 5 mg by mouth 2 (two) times daily. Take 2 tablets (1017m2 times daily x 7 days, then 1 tablet (5mg55m times daily.) 70 tablet 0  . calcium-vitamin D (OSCAL WITH D) 500-200 MG-UNIT tablet Take 1 tablet by mouth.    . diclofenac (VOLTAREN) 50 MG EC tablet Take 50 mg by mouth 2 (two) times daily.    . esMarland Kitchenitalopram (LEXAPRO) 20 MG tablet Take 20 mg by mouth daily.     . fluticasone (FLONASE) 50 MCG/ACT nasal spray Place 2 sprays into both nostrils daily.  5  . furosemide (LASIX) 20 MG tablet TAKE ONE TABLET BY MOUTH EVERY MORNING 30 tablet 3  . levothyroxine (SYNTHROID, LEVOTHROID) 75 MCG tablet Take 75 mcg by mouth daily before breakfast.  11  . losartan (COZAAR) 25 MG tablet TAKE 1 TABLET BY MOUTH EVERY DAY  90 tablet 1  . metoprolol succinate (TOPROL-XL) 25 MG 24 hr tablet Take 0.5 tablets (12.5 mg total) by mouth daily. 30 tablet 3  . MYRBETRIQ 25 MG TB24 tablet Take 25 mg by mouth daily.  11  . nitroGLYCERIN (NITROSTAT) 0.4 MG SL tablet nitroglycerin 0.4 mg sublingual tablet  DISSOLVE 1 TABLET UNDER THE TONGUE EVERY 5 MINUTES AS NEEDED FOR CHEST PAIN. DO NOT EXCEED A TOTAL OF 3 DOSES IN 15 MINUTES.    . poMarland Kitchenyethylene glycol (MIRALAX / GLYCOLAX) packet Take 17 g by mouth daily as needed.     . simvastatin (ZOCOR) 40 MG tablet Take 40 mg by mouth every evening. On hold for a few months  2  . traMADol (ULTRAM) 50 MG tablet Take 50 mg by mouth every 6 (six) hours as needed.    . traZODone (DESYREL) 100 MG tablet Take 50-100 mg by mouth at bedtime as needed for sleep.  2   No current facility-administered medications on file prior to visit.    Cardiovascular & other pertient studies:  EKG 12/29/2019: Sinus rhythm 59 bpm Nonspecific ST-T changes   Echocardiogram 07/20/2018 :  Left ventricle cavity is normal in size. Moderate concentric hypertrophy  of the left ventricle. Normal global wall motion. Visual EF is 50-55%.  Doppler evidence of grade II (pseudonormal) diastolic dysfunction.  Diastolic dysfunction findings suggests elevated LA/LV end  diastolic  pressure.  Left atrial cavity is moderately dilated by volume visually.  Exercise myoview stress 07/16/2018:  1. Lexiscan stress test was performed. Exercise capacity was not assessed. Stress symptoms included dyspnea, dizziness. Resting blood pressure was 134/86 mmHg and peak effect blood pressure was 118/68 mmHg. The resting and stress electrocardiogram demonstrated normal sinus rhythm, normal resting conduction, no arrhythmias and normal repolarization.  Stress EKG is non diagnostic for ischemia as it is a pharmacologic stress.  2. The overall quality of the study is good. There is no evidence of abnormal lung activity. Stress and rest SPECT  images demonstrate homogeneous tracer distribution throughout the myocardium. Gated SPECT imaging reveals normal myocardial thickening and wall motion. The left ventricular ejection fraction was normal (65%).   3. Low risk study.   Recent labs: 06/27/2019: Glucose 90, BUN/Cr 22/1.1. EGFR 52 Chol 170, TG 228, HDL 53, LDL 79 TSH 1.3 normal    Review of Systems  Cardiovascular: Positive for dyspnea on exertion (Mild, chronic, stable). Negative for chest pain, leg swelling, palpitations and syncope.        Vitals:   12/29/19 1317  BP: (!) 111/39  Pulse: 63  Resp: 17  SpO2: 99%     Body mass index is 37.38 kg/m. Filed Weights   12/29/19 1317  Weight: 211 lb (95.7 kg)     Objective:   Physical Exam Vitals and nursing note reviewed.  Constitutional:      Appearance: She is well-developed.  Neck:     Vascular: No JVD.  Cardiovascular:     Rate and Rhythm: Normal rate and regular rhythm.     Pulses: Intact distal pulses.     Heart sounds: Normal heart sounds. No murmur heard.   Pulmonary:     Effort: Pulmonary effort is normal.     Breath sounds: Normal breath sounds. No wheezing or rales.         Assessment & Recommendations:   84 year old Caucasian female with hypertension,  hyperlipidemia, hypothyroidism, history of recurrent DVT/PE, HFpEF, ophthalmic migraine.  Syncope: Normal resting EKG.  Recommend 2-week monitor to rule out any arrhythmogenic etiology. Given her history of DVT, will also obtain an echocardiogram to rule out any severe pulmonary hypertension issues. I also stopped her metoprolol and reduce Lasix for as needed use only for HFpEF, leg edema If above work-up unremarkable, consider referral to neurology or ENT.  F/u in 4 months.   Nigel Mormon, MD [facility] [place] Pager: [phone] Office: [phone]

## 2020-01-10 ENCOUNTER — Other Ambulatory Visit: Payer: Self-pay | Admitting: Cardiology

## 2020-01-10 DIAGNOSIS — I209 Angina pectoris, unspecified: Secondary | ICD-10-CM

## 2020-01-20 ENCOUNTER — Ambulatory Visit: Payer: Medicare Other

## 2020-01-20 ENCOUNTER — Other Ambulatory Visit: Payer: Self-pay

## 2020-01-20 DIAGNOSIS — R55 Syncope and collapse: Secondary | ICD-10-CM

## 2020-03-16 ENCOUNTER — Ambulatory Visit: Payer: Medicare Other | Admitting: Cardiology

## 2020-04-11 ENCOUNTER — Other Ambulatory Visit: Payer: Self-pay

## 2020-04-11 ENCOUNTER — Encounter: Payer: Self-pay | Admitting: Cardiology

## 2020-04-11 ENCOUNTER — Ambulatory Visit: Payer: Medicare Other | Admitting: Cardiology

## 2020-04-11 VITALS — BP 136/76 | HR 94 | Resp 16 | Ht 63.0 in | Wt 209.0 lb

## 2020-04-11 DIAGNOSIS — E782 Mixed hyperlipidemia: Secondary | ICD-10-CM

## 2020-04-11 DIAGNOSIS — I471 Supraventricular tachycardia: Secondary | ICD-10-CM

## 2020-04-11 DIAGNOSIS — R55 Syncope and collapse: Secondary | ICD-10-CM

## 2020-04-11 DIAGNOSIS — I6522 Occlusion and stenosis of left carotid artery: Secondary | ICD-10-CM

## 2020-04-11 DIAGNOSIS — I10 Essential (primary) hypertension: Secondary | ICD-10-CM

## 2020-04-11 MED ORDER — DILTIAZEM HCL 30 MG PO TABS: 30.0000 mg | ORAL_TABLET | Freq: Three times a day (TID) | ORAL | 1 refills | Status: DC | PRN

## 2020-04-11 NOTE — Progress Notes (Signed)
Follow up visit  Subjective:   Jean King, female    DOB: 1936-04-30, 84 y.o.   MRN: [id number]   HPI   Chief Complaint  Patient presents with  . Chronic diastolic heart failure  . Follow-up    85 year old Caucasian female with hypertension,  hyperlipidemia, hypothyroidism, history of recurrent DVT/PE, HFpEF, ophthalmic migraine, now with PSVT  No clear etiology of syncope was found on cardiac work-up, details below.  She has not had any recurrent syncope episodes.  However, she does report episodes of palpitations most mornings.  Symptoms are short lasting for 2-3 minutes, relieved with deep breathing.   Current Outpatient Medications on File Prior to Visit  Medication Sig Dispense Refill  . acetaminophen (TYLENOL) 500 MG tablet Take 500 mg by mouth every 6 (six) hours as needed.    Marland Kitchen apixaban (ELIQUIS) 5 MG TABS tablet Take 1-2 tablets (5-10 mg total) by mouth 2 (two) times daily. Take 2 tablets (62m) 2 times daily x 7 days, then 1 tablet (565m 2 times daily. (Patient taking differently: Take 5 mg by mouth 2 (two) times daily. Take 2 tablets (1025m2 times daily x 7 days, then 1 tablet (5mg65m times daily.) 70 tablet 0  . calcium-vitamin D (OSCAL WITH D) 500-200 MG-UNIT tablet Take 1 tablet by mouth.    . escitalopram (LEXAPRO) 20 MG tablet Take 20 mg by mouth daily.     . fluticasone (FLONASE) 50 MCG/ACT nasal spray Place 2 sprays into both nostrils daily.  5  . furosemide (LASIX) 20 MG tablet Take 1 tablet (20 mg total) by mouth as needed. 30 tablet 3  . levothyroxine (SYNTHROID, LEVOTHROID) 75 MCG tablet Take 75 mcg by mouth daily before breakfast.  11  . losartan (COZAAR) 25 MG tablet TAKE 1 TABLET BY MOUTH EVERY DAY 90 tablet 1  . MYRBETRIQ 25 MG TB24 tablet Take 25 mg by mouth daily.  11  . nitroGLYCERIN (NITROSTAT) 0.4 MG SL tablet nitroglycerin 0.4 mg sublingual tablet  DISSOLVE 1 TABLET UNDER THE TONGUE EVERY 5 MINUTES AS NEEDED FOR CHEST PAIN. DO NOT EXCEED A TOTAL  OF 3 DOSES IN 15 MINUTES.    . poMarland Kitchenyethylene glycol (MIRALAX / GLYCOLAX) packet Take 17 g by mouth daily as needed.     . simvastatin (ZOCOR) 40 MG tablet Take 40 mg by mouth every evening. On hold for a few months  2  . traMADol (ULTRAM) 50 MG tablet Take 50 mg by mouth every 6 (six) hours as needed.    . traZODone (DESYREL) 100 MG tablet Take 50-100 mg by mouth at bedtime as needed for sleep.  2   No current facility-administered medications on file prior to visit.    Cardiovascular & other pertient studies:   Echocardiogram 01/20/2020:  Left ventricle cavity is normal in size. Moderate concentric hypertrophy  of the left ventricle. Normal global wall motion. Normal LV systolic  function with visual EF 50-55%. Doppler evidence of grade I (impaired)  diastolic dysfunction, normal LAP.  Mild tricuspid regurgitation.  No evidence of pulmonary hypertension.  Compared to previous study on 07/20/2018, left atrial size and filling  pressures are lower.   Mobile cardiac telemetry 13 days 12/29/2019 - 01/12/2020: Dominant rhythm: Sinus. HR 45-171 bpm. Avg HR 65 bpm. 19 episodes of SVT, fastest at 171 bpm for 10 beats, longest for 16 beats at 123 bpm. <1% SVE burden. Rare PAC/PVC, <1% SVE & VE burden No atrial fibrillation/atrial flutter/SVT/VT/high grade AV block, sinus  pause >3sec noted. 1 patient triggered events with PAC.   EKG 12/29/2019: Sinus rhythm 59 bpm Nonspecific ST-T changes  Carotid artery duplex 09/16/2019:  No evidence of significant stenosis in the right carotid vessels.  Stenosis in the left internal carotid artery (16-49%) with homogeneous  plaque.  Antegrade right vertebral artery flow. Antegrade left vertebral artery  flow.  Follow up in one year is appropriate if clinically indicated.  Echocardiogram 07/20/2018 :  Left ventricle cavity is normal in size. Moderate concentric hypertrophy  of the left ventricle. Normal global wall motion. Visual EF is 50-55%.   Doppler evidence of grade II (pseudonormal) diastolic dysfunction.  Diastolic dysfunction findings suggests elevated LA/LV end diastolic  pressure.  Left atrial cavity is moderately dilated by volume visually.  Exercise myoview stress 07/16/2018:  1. Lexiscan stress test was performed. Exercise capacity was not assessed. Stress symptoms included dyspnea, dizziness. Resting blood pressure was 134/86 mmHg and peak effect blood pressure was 118/68 mmHg. The resting and stress electrocardiogram demonstrated normal sinus rhythm, normal resting conduction, no arrhythmias and normal repolarization.  Stress EKG is non diagnostic for ischemia as it is a pharmacologic stress.  2. The overall quality of the study is good. There is no evidence of abnormal lung activity. Stress and rest SPECT images demonstrate homogeneous tracer distribution throughout the myocardium. Gated SPECT imaging reveals normal myocardial thickening and wall motion. The left ventricular ejection fraction was normal (65%).   3. Low risk study.   Recent labs: 06/27/2019: Glucose 90, BUN/Cr 22/1.1. EGFR 52 Chol 170, TG 228, HDL 53, LDL 79 TSH 1.3 normal    Review of Systems  Cardiovascular: Positive for dyspnea on exertion (Mild, chronic, stable). Negative for chest pain, leg swelling, palpitations and syncope.        Vitals:   04/11/20 1142  BP: 136/76  Pulse: 94  Resp: 16  SpO2: 99%     Body mass index is 37.02 kg/m. Filed Weights   04/11/20 1142  Weight: 209 lb (94.8 kg)     Objective:   Physical Exam Vitals and nursing note reviewed.  Constitutional:      Appearance: She is well-developed.  Neck:     Vascular: No JVD.  Cardiovascular:     Rate and Rhythm: Normal rate and regular rhythm.     Pulses: Intact distal pulses.     Heart sounds: Normal heart sounds. No murmur heard.   Pulmonary:     Effort: Pulmonary effort is normal.     Breath sounds: Normal breath sounds. No wheezing or rales.          Assessment & Recommendations:   84 year old Caucasian female with hypertension,  hyperlipidemia, hypothyroidism, history of recurrent DVT/PE, HFpEF, ophthalmic migraine.  Syncope: Structurally normal heart on echocardiogram. Cardiac telemetry reviewed episodes of SVT up to 16 beats.  Less likely to have been the reason for her syncope, although postconversion pause could cause syncope. No significant stenosis on carotid ultrasound.  SVT: Paroxysmal episodes.  Discussed vagal maneuvers.  Prescribed diltiazem 30 mg p.o. for as needed use.  Of note, she has had significant dizziness with metoprolol in the past.  Patient has recurrent severe symptoms, could consider daily diltiazem and/or referral for ablation.  Follow-up in 3 months.  Nigel Mormon, MD [facility] [place] Pager: [phone] Office: [phone]

## 2020-05-17 ENCOUNTER — Other Ambulatory Visit: Payer: Self-pay

## 2020-05-17 DIAGNOSIS — R55 Syncope and collapse: Secondary | ICD-10-CM

## 2020-05-17 MED ORDER — FUROSEMIDE 20 MG PO TABS: 20.0000 mg | ORAL_TABLET | ORAL | 3 refills | Status: DC | PRN

## 2020-05-28 ENCOUNTER — Other Ambulatory Visit: Payer: Self-pay

## 2020-05-28 DIAGNOSIS — G4700 Insomnia, unspecified: Secondary | ICD-10-CM | POA: Diagnosis not present

## 2020-05-28 DIAGNOSIS — R55 Syncope and collapse: Secondary | ICD-10-CM

## 2020-05-28 DIAGNOSIS — E785 Hyperlipidemia, unspecified: Secondary | ICD-10-CM | POA: Diagnosis not present

## 2020-05-28 DIAGNOSIS — M1612 Unilateral primary osteoarthritis, left hip: Secondary | ICD-10-CM | POA: Diagnosis not present

## 2020-05-28 DIAGNOSIS — K219 Gastro-esophageal reflux disease without esophagitis: Secondary | ICD-10-CM | POA: Diagnosis not present

## 2020-05-28 DIAGNOSIS — I10 Essential (primary) hypertension: Secondary | ICD-10-CM | POA: Diagnosis not present

## 2020-05-28 DIAGNOSIS — I208 Other forms of angina pectoris: Secondary | ICD-10-CM | POA: Diagnosis not present

## 2020-05-28 DIAGNOSIS — E039 Hypothyroidism, unspecified: Secondary | ICD-10-CM | POA: Diagnosis not present

## 2020-05-28 MED ORDER — FUROSEMIDE 20 MG PO TABS: 20.0000 mg | ORAL_TABLET | ORAL | 3 refills | Status: DC | PRN

## 2020-06-22 ENCOUNTER — Other Ambulatory Visit: Payer: Self-pay

## 2020-06-22 DIAGNOSIS — I10 Essential (primary) hypertension: Secondary | ICD-10-CM | POA: Diagnosis not present

## 2020-06-22 DIAGNOSIS — G4700 Insomnia, unspecified: Secondary | ICD-10-CM | POA: Diagnosis not present

## 2020-06-22 DIAGNOSIS — I471 Supraventricular tachycardia: Secondary | ICD-10-CM

## 2020-06-22 DIAGNOSIS — E785 Hyperlipidemia, unspecified: Secondary | ICD-10-CM | POA: Diagnosis not present

## 2020-06-22 DIAGNOSIS — E039 Hypothyroidism, unspecified: Secondary | ICD-10-CM | POA: Diagnosis not present

## 2020-06-22 DIAGNOSIS — I208 Other forms of angina pectoris: Secondary | ICD-10-CM | POA: Diagnosis not present

## 2020-06-22 DIAGNOSIS — M1612 Unilateral primary osteoarthritis, left hip: Secondary | ICD-10-CM | POA: Diagnosis not present

## 2020-06-22 DIAGNOSIS — K219 Gastro-esophageal reflux disease without esophagitis: Secondary | ICD-10-CM | POA: Diagnosis not present

## 2020-06-22 MED ORDER — DILTIAZEM HCL 30 MG PO TABS: 30.0000 mg | ORAL_TABLET | Freq: Three times a day (TID) | ORAL | 1 refills | Status: DC | PRN

## 2020-07-11 ENCOUNTER — Encounter: Payer: Self-pay | Admitting: Cardiology

## 2020-07-11 ENCOUNTER — Other Ambulatory Visit: Payer: Self-pay

## 2020-07-11 ENCOUNTER — Ambulatory Visit: Payer: Medicare Other | Admitting: Cardiology

## 2020-07-11 ENCOUNTER — Inpatient Hospital Stay: Payer: Medicare Other

## 2020-07-11 VITALS — BP 128/67 | HR 79 | Temp 97.8°F | Resp 17 | Ht 63.0 in | Wt 208.0 lb

## 2020-07-11 DIAGNOSIS — E782 Mixed hyperlipidemia: Secondary | ICD-10-CM

## 2020-07-11 DIAGNOSIS — R55 Syncope and collapse: Secondary | ICD-10-CM

## 2020-07-11 DIAGNOSIS — I471 Supraventricular tachycardia, unspecified: Secondary | ICD-10-CM

## 2020-07-11 DIAGNOSIS — R002 Palpitations: Secondary | ICD-10-CM | POA: Diagnosis not present

## 2020-07-11 DIAGNOSIS — I10 Essential (primary) hypertension: Secondary | ICD-10-CM

## 2020-07-11 DIAGNOSIS — I6522 Occlusion and stenosis of left carotid artery: Secondary | ICD-10-CM | POA: Diagnosis not present

## 2020-07-11 MED ORDER — ROSUVASTATIN CALCIUM 10 MG PO TABS: 20.0000 mg | ORAL_TABLET | Freq: Every day | ORAL | 3 refills | Status: DC

## 2020-07-11 MED ORDER — FUROSEMIDE 20 MG PO TABS: 20.0000 mg | ORAL_TABLET | ORAL | 3 refills | Status: DC | PRN

## 2020-07-11 MED ORDER — DILTIAZEM HCL ER COATED BEADS 120 MG PO CP24: 120.0000 mg | ORAL_CAPSULE | Freq: Every day | ORAL | 3 refills | Status: DC

## 2020-07-11 NOTE — Progress Notes (Signed)
Follow up visit  Subjective:   Jean King, female    DOB: [birth year], 85 y.o.   MRN: [id number]   HPI   Chief Complaint  Patient presents with  . Syncope and collapse  . Hypertension  . Follow-up    3 month    85 year old Caucasian female with hypertension,  hyperlipidemia, hypothyroidism, history of recurrent DVT/PE, HFpEF, ophthalmic migraine, now with PSVT  Patient has had episodes of palpitations waking up from sleep. She has occasionally taken diltiazem 30 mg with improvement in symptoms.   Current Outpatient Medications on File Prior to Visit  Medication Sig Dispense Refill  . acetaminophen (TYLENOL) 500 MG tablet Take 500 mg by mouth every 6 (six) hours as needed.    Marland Kitchen apixaban (ELIQUIS) 5 MG TABS tablet Take 1-2 tablets (5-10 mg total) by mouth 2 (two) times daily. Take 2 tablets (25m) 2 times daily x 7 days, then 1 tablet (566m 2 times daily. (Patient taking differently: Take 5 mg by mouth 2 (two) times daily. Take 2 tablets (1053m2 times daily x 7 days, then 1 tablet (5mg38m times daily.) 70 tablet 0  . calcium-vitamin D (OSCAL WITH D) 500-200 MG-UNIT tablet Take 1 tablet by mouth.    . diltiazem (CARDIZEM) 30 MG tablet Take 1 tablet (30 mg total) by mouth every 8 (eight) hours as needed. 270 tablet 1  . escitalopram (LEXAPRO) 20 MG tablet Take 20 mg by mouth daily.     . fluticasone (FLONASE) 50 MCG/ACT nasal spray Place 2 sprays into both nostrils daily.  5  . furosemide (LASIX) 20 MG tablet Take 1 tablet (20 mg total) by mouth as needed. 30 tablet 3  . levothyroxine (SYNTHROID, LEVOTHROID) 75 MCG tablet Take 75 mcg by mouth daily before breakfast.  11  . losartan (COZAAR) 25 MG tablet TAKE 1 TABLET BY MOUTH EVERY DAY 90 tablet 1  . MYRBETRIQ 25 MG TB24 tablet Take 25 mg by mouth daily.  11  . nitroGLYCERIN (NITROSTAT) 0.4 MG SL tablet nitroglycerin 0.4 mg sublingual tablet  DISSOLVE 1 TABLET UNDER THE TONGUE EVERY 5 MINUTES AS NEEDED FOR CHEST PAIN. DO NOT EXCEED  A TOTAL OF 3 DOSES IN 15 MINUTES.    . poMarland Kitchenyethylene glycol (MIRALAX / GLYCOLAX) packet Take 17 g by mouth daily as needed.     . simvastatin (ZOCOR) 40 MG tablet Take 40 mg by mouth every evening. On hold for a few months  2  . traMADol (ULTRAM) 50 MG tablet Take 50 mg by mouth every 6 (six) hours as needed.    . traZODone (DESYREL) 100 MG tablet Take 50-100 mg by mouth at bedtime as needed for sleep.  2   No current facility-administered medications on file prior to visit.    Cardiovascular & other pertient studies:  EKG 07/11/2020: Sinus rhythm 78 bom Normal EKG  Echocardiogram 01/20/2020:  Left ventricle cavity is normal in size. Moderate concentric hypertrophy  of the left ventricle. Normal global wall motion. Normal LV systolic  function with visual EF 50-55%. Doppler evidence of grade I (impaired)  diastolic dysfunction, normal LAP.  Mild tricuspid regurgitation.  No evidence of pulmonary hypertension.  Compared to previous study on 07/20/2018, left atrial size and filling  pressures are lower.   Mobile cardiac telemetry 13 days 12/29/2019 - 01/12/2020: Dominant rhythm: Sinus. HR 45-171 bpm. Avg HR 65 bpm. 19 episodes of SVT, fastest at 171 bpm for 10 beats, longest for 16 beats at 123 bpm. <1%  SVE burden. Rare PAC/PVC, <1% SVE & VE burden No atrial fibrillation/atrial flutter/SVT/VT/high grade AV block, sinus pause >3sec noted. 1 patient triggered events with PAC.  Carotid artery duplex 09/16/2019:  No evidence of significant stenosis in the right carotid vessels.  Stenosis in the left internal carotid artery (16-49%) with homogeneous  plaque.  Antegrade right vertebral artery flow. Antegrade left vertebral artery  flow.  Follow up in one year is appropriate if clinically indicated.  Exercise myoview stress 07/16/2018:  1. Lexiscan stress test was performed. Exercise capacity was not assessed. Stress symptoms included dyspnea, dizziness. Resting blood pressure was 134/86  mmHg and peak effect blood pressure was 118/68 mmHg. The resting and stress electrocardiogram demonstrated normal sinus rhythm, normal resting conduction, no arrhythmias and normal repolarization.  Stress EKG is non diagnostic for ischemia as it is a pharmacologic stress.  2. The overall quality of the study is good. There is no evidence of abnormal lung activity. Stress and rest SPECT images demonstrate homogeneous tracer distribution throughout the myocardium. Gated SPECT imaging reveals normal myocardial thickening and wall motion. The left ventricular ejection fraction was normal (65%).   3. Low risk study.   Recent labs: 06/27/2019: Glucose 90, BUN/Cr 22/1.1. EGFR 52 Chol 170, TG 228, HDL 53, LDL 79 TSH 1.3 normal    Review of Systems  Cardiovascular: Positive for dyspnea on exertion (Mild, chronic, stable). Negative for chest pain, leg swelling, palpitations and syncope.        Vitals:   07/11/20 1335  BP: 128/67  Pulse: 79  Resp: 17  Temp: 97.8 F (36.6 C)  SpO2: 99%     Body mass index is 36.85 kg/m. Filed Weights   07/11/20 1335  Weight: 208 lb (94.3 kg)     Objective:   Physical Exam Vitals and nursing note reviewed.  Constitutional:      Appearance: She is well-developed.  Neck:     Vascular: No JVD.  Cardiovascular:     Rate and Rhythm: Normal rate and regular rhythm.     Pulses: Intact distal pulses.     Heart sounds: Normal heart sounds. No murmur heard.   Pulmonary:     Effort: Pulmonary effort is normal.     Breath sounds: Normal breath sounds. No wheezing or rales.         Assessment & Recommendations:   85 year old Caucasian female with hypertension,  hyperlipidemia, hypothyroidism, history of recurrent DVT/PE, HFpEF, ophthalmic migraine.  Syncope: Structurally normal heart on echocardiogram. Cardiac telemetry reviewed episodes of SVT up to 16 beats.  Less likely to have been the reason for her syncope, although postconversion pause  could cause syncope. No significant stenosis on carotid ultrasound.  SVT: Paroxysmal episodes. Will put him on diltiazem 120 mg daily.  Will place on cardiac telemetry again to ensure no Afib.  Discussed vagal maneuvers.  F/u in 4-6 weeks  Janathan Bribiesca Esther Hardy, MD [facility] [place] Pager: [phone] Office: [phone]

## 2020-07-12 DIAGNOSIS — E559 Vitamin D deficiency, unspecified: Secondary | ICD-10-CM | POA: Diagnosis not present

## 2020-07-12 DIAGNOSIS — Z0001 Encounter for general adult medical examination with abnormal findings: Secondary | ICD-10-CM | POA: Diagnosis not present

## 2020-07-12 DIAGNOSIS — E785 Hyperlipidemia, unspecified: Secondary | ICD-10-CM | POA: Diagnosis not present

## 2020-07-12 DIAGNOSIS — G4700 Insomnia, unspecified: Secondary | ICD-10-CM | POA: Diagnosis not present

## 2020-07-12 DIAGNOSIS — E039 Hypothyroidism, unspecified: Secondary | ICD-10-CM | POA: Diagnosis not present

## 2020-07-12 DIAGNOSIS — K219 Gastro-esophageal reflux disease without esophagitis: Secondary | ICD-10-CM | POA: Diagnosis not present

## 2020-07-12 DIAGNOSIS — R55 Syncope and collapse: Secondary | ICD-10-CM | POA: Diagnosis not present

## 2020-07-12 DIAGNOSIS — I10 Essential (primary) hypertension: Secondary | ICD-10-CM | POA: Diagnosis not present

## 2020-07-12 DIAGNOSIS — I2699 Other pulmonary embolism without acute cor pulmonale: Secondary | ICD-10-CM | POA: Diagnosis not present

## 2020-07-12 DIAGNOSIS — Z79899 Other long term (current) drug therapy: Secondary | ICD-10-CM | POA: Diagnosis not present

## 2020-07-12 DIAGNOSIS — M5416 Radiculopathy, lumbar region: Secondary | ICD-10-CM | POA: Diagnosis not present

## 2020-07-20 ENCOUNTER — Other Ambulatory Visit: Payer: Self-pay

## 2020-07-20 DIAGNOSIS — H26491 Other secondary cataract, right eye: Secondary | ICD-10-CM | POA: Diagnosis not present

## 2020-07-20 DIAGNOSIS — I10 Essential (primary) hypertension: Secondary | ICD-10-CM

## 2020-07-20 MED ORDER — FUROSEMIDE 20 MG PO TABS: 20.0000 mg | ORAL_TABLET | ORAL | 3 refills | Status: DC | PRN

## 2020-07-25 DIAGNOSIS — G4700 Insomnia, unspecified: Secondary | ICD-10-CM | POA: Diagnosis not present

## 2020-07-25 DIAGNOSIS — E039 Hypothyroidism, unspecified: Secondary | ICD-10-CM | POA: Diagnosis not present

## 2020-07-25 DIAGNOSIS — E785 Hyperlipidemia, unspecified: Secondary | ICD-10-CM | POA: Diagnosis not present

## 2020-07-25 DIAGNOSIS — K219 Gastro-esophageal reflux disease without esophagitis: Secondary | ICD-10-CM | POA: Diagnosis not present

## 2020-07-25 DIAGNOSIS — M1612 Unilateral primary osteoarthritis, left hip: Secondary | ICD-10-CM | POA: Diagnosis not present

## 2020-07-25 DIAGNOSIS — I208 Other forms of angina pectoris: Secondary | ICD-10-CM | POA: Diagnosis not present

## 2020-07-25 DIAGNOSIS — I10 Essential (primary) hypertension: Secondary | ICD-10-CM | POA: Diagnosis not present

## 2020-07-27 DIAGNOSIS — R002 Palpitations: Secondary | ICD-10-CM | POA: Diagnosis not present

## 2020-07-27 DIAGNOSIS — I471 Supraventricular tachycardia: Secondary | ICD-10-CM | POA: Diagnosis not present

## 2020-08-03 DIAGNOSIS — I471 Supraventricular tachycardia: Secondary | ICD-10-CM | POA: Diagnosis not present

## 2020-08-03 DIAGNOSIS — R002 Palpitations: Secondary | ICD-10-CM | POA: Diagnosis not present

## 2020-08-20 ENCOUNTER — Ambulatory Visit: Payer: Medicare Other | Admitting: Cardiology

## 2020-08-20 ENCOUNTER — Other Ambulatory Visit: Payer: Self-pay

## 2020-08-20 ENCOUNTER — Encounter: Payer: Self-pay | Admitting: Cardiology

## 2020-08-20 VITALS — Temp 98.3°F | Resp 16 | Ht 63.0 in | Wt 202.0 lb

## 2020-08-20 DIAGNOSIS — I4729 Other ventricular tachycardia: Secondary | ICD-10-CM | POA: Insufficient documentation

## 2020-08-20 DIAGNOSIS — I471 Supraventricular tachycardia: Secondary | ICD-10-CM | POA: Diagnosis not present

## 2020-08-20 DIAGNOSIS — I10 Essential (primary) hypertension: Secondary | ICD-10-CM

## 2020-08-20 DIAGNOSIS — I472 Ventricular tachycardia: Secondary | ICD-10-CM | POA: Diagnosis not present

## 2020-08-20 NOTE — Progress Notes (Signed)
Follow up visit  Subjective:   Jean King, female    DOB: April 05, 1936, 85 y.o.   MRN: [id number]   HPI   Chief Complaint  Patient presents with  . Syncope and collapse  . Follow-up    4-6 week     85 year old Caucasian female with hypertension,  hyperlipidemia, hypothyroidism, history of recurrent DVT/PE, HFpEF, ophthalmic migraine, now with PSVT, NSVT  Reviewed recent monitor results with the patient.  Since starting diltiazem 120 mg daily, she has not had any recurrent palpitation symptoms that used to wake her up at night.  Patient denies any dizziness today.  Current Outpatient Medications on File Prior to Visit  Medication Sig Dispense Refill  . acetaminophen (TYLENOL) 500 MG tablet Take 500 mg by mouth every 6 (six) hours as needed.    Marland Kitchen apixaban (ELIQUIS) 5 MG TABS tablet Take 1-2 tablets (5-10 mg total) by mouth 2 (two) times daily. Take 2 tablets (10m) 2 times daily x 7 days, then 1 tablet (546m 2 times daily. (Patient taking differently: Take 5 mg by mouth 2 (two) times daily. Take 2 tablets (1060m2 times daily x 7 days, then 1 tablet (5mg67m times daily.) 70 tablet 0  . calcium-vitamin D (OSCAL WITH D) 500-200 MG-UNIT tablet Take 1 tablet by mouth.    . diltiazem (CARDIZEM CD) 120 MG 24 hr capsule Take 1 capsule (120 mg total) by mouth daily. 30 capsule 3  . escitalopram (LEXAPRO) 20 MG tablet Take 20 mg by mouth daily.     . fluticasone (FLONASE) 50 MCG/ACT nasal spray Place 2 sprays into both nostrils daily.  5  . furosemide (LASIX) 20 MG tablet Take 1 tablet (20 mg total) by mouth as needed. 60 tablet 3  . levothyroxine (SYNTHROID, LEVOTHROID) 75 MCG tablet Take 75 mcg by mouth daily before breakfast.  11  . losartan (COZAAR) 25 MG tablet TAKE 1 TABLET BY MOUTH EVERY DAY 90 tablet 1  . melatonin 5 MG TABS 10 tablets daily.    . MYMarland KitchenBETRIQ 25 MG TB24 tablet Take 25 mg by mouth daily.  11  . nitroGLYCERIN (NITROSTAT) 0.4 MG SL tablet nitroglycerin 0.4 mg sublingual  tablet  DISSOLVE 1 TABLET UNDER THE TONGUE EVERY 5 MINUTES AS NEEDED FOR CHEST PAIN. DO NOT EXCEED A TOTAL OF 3 DOSES IN 15 MINUTES.    . poMarland Kitchenyethylene glycol (MIRALAX / GLYCOLAX) packet Take 17 g by mouth daily as needed.     . rosuvastatin (CRESTOR) 10 MG tablet Take 2 tablets (20 mg total) by mouth daily. 90 tablet 3  . traMADol (ULTRAM) 50 MG tablet Take 50 mg by mouth every 6 (six) hours as needed.    . traZODone (DESYREL) 100 MG tablet Take 50-100 mg by mouth at bedtime as needed for sleep.  2   No current facility-administered medications on file prior to visit.    Cardiovascular & other pertient studies:  Mobile cardiac telemetry 13 days 07/11/2020 - 07/25/2020: Dominant rhythm: Sinus. HR 44-115 bpm. Avg HR 58 bpm, while in sinus rhythm. 36 episodes of SVT, fastest at 169 bpm for 8 beats, longest for 19 beats at 138 bpm. <1% isolated SVE, couplet/triplets. 1 episode of VT at 139 bpm for 6 beats (07/13/2020 3:58 PM) <1% isolated VE, couplet No atrial fibrillation/atrial flutter//high grade AV block, sinus pause >3sec noted. 1 patient triggered events, correlated with sinus rhythm.   EKG 07/11/2020: Sinus rhythm 78 bom Normal EKG  Echocardiogram 01/20/2020:  Left ventricle cavity  is normal in size. Moderate concentric hypertrophy  of the left ventricle. Normal global wall motion. Normal LV systolic  function with visual EF 50-55%. Doppler evidence of grade I (impaired)  diastolic dysfunction, normal LAP.  Mild tricuspid regurgitation.  No evidence of pulmonary hypertension.  Compared to previous study on 07/20/2018, left atrial size and filling  pressures are lower.   Mobile cardiac telemetry 13 days 12/29/2019 - 01/12/2020: Dominant rhythm: Sinus. HR 45-171 bpm. Avg HR 65 bpm. 19 episodes of SVT, fastest at 171 bpm for 10 beats, longest for 16 beats at 123 bpm. <1% SVE burden. Rare PAC/PVC, <1% SVE & VE burden No atrial fibrillation/atrial flutter/SVT/VT/high grade AV block,  sinus pause >3sec noted. 1 patient triggered events with PAC.  Carotid artery duplex 09/16/2019:  No evidence of significant stenosis in the right carotid vessels.  Stenosis in the left internal carotid artery (16-49%) with homogeneous  plaque.  Antegrade right vertebral artery flow. Antegrade left vertebral artery  flow.  Follow up in one year is appropriate if clinically indicated.  Exercise myoview stress 07/16/2018:  1. Lexiscan stress test was performed. Exercise capacity was not assessed. Stress symptoms included dyspnea, dizziness. Resting blood pressure was 134/86 mmHg and peak effect blood pressure was 118/68 mmHg. The resting and stress electrocardiogram demonstrated normal sinus rhythm, normal resting conduction, no arrhythmias and normal repolarization.  Stress EKG is non diagnostic for ischemia as it is a pharmacologic stress.  2. The overall quality of the study is good. There is no evidence of abnormal lung activity. Stress and rest SPECT images demonstrate homogeneous tracer distribution throughout the myocardium. Gated SPECT imaging reveals normal myocardial thickening and wall motion. The left ventricular ejection fraction was normal (65%).   3. Low risk study.   Recent labs: 06/27/2019: Glucose 90, BUN/Cr 22/1.1. EGFR 52 Chol 170, TG 228, HDL 53, LDL 79 TSH 1.3 normal    Review of Systems  Cardiovascular: Positive for dyspnea on exertion (Mild, chronic, stable). Negative for chest pain, leg swelling, palpitations and syncope.        Vitals:   08/20/20 1420 08/20/20 1421  Resp:    Temp:    SpO2: 98% 99%     Body mass index is 35.78 kg/m. Filed Weights   08/20/20 1415  Weight: 202 lb (91.6 kg)    Orthostatic VS for the past 72 hrs (Last 3 readings):  Orthostatic BP Patient Position BP Location Cuff Size Orthostatic Pulse  08/20/20 1421 (!) 89/53 Standing Left Arm Large 63  08/20/20 1420 119/68 Sitting Left Arm Large 70  08/20/20 1415 120/57 Supine  Left Arm Large 63     Objective:   Physical Exam Vitals and nursing note reviewed.  Constitutional:      Appearance: She is well-developed.  Neck:     Vascular: No JVD.  Cardiovascular:     Rate and Rhythm: Normal rate and regular rhythm.     Pulses: Intact distal pulses.     Heart sounds: Normal heart sounds. No murmur heard.   Pulmonary:     Effort: Pulmonary effort is normal.     Breath sounds: Normal breath sounds. No wheezing or rales.         Assessment & Recommendations:   85 year old Caucasian female with hypertension,  hyperlipidemia, hypothyroidism, history of recurrent DVT/PE, HFpEF, ophthalmic migraine.  Syncope: Structurally normal heart on echocardiogram. Cardiac telemetry reviewed episodes of SVT up to 16 beats.  Less likely to have been the reason for her syncope, although postconversion  pause could cause syncope. More likely, orthostatic hypotension could have been cause for her syncope in the past. Patient is asymptomatic today, but does have drop in systolic blood pressure from 120 mmHg down to 89 mmHg on standing. I would like to avoid stopping diltiazem, given that she has had improvement in her palpitation symptoms on this. Instead, I stop losartan.  Encourage patient to continue liberal hydration.  PSVT/NSVT: Paroxysmal episodes.  Well-controlled on diltiazem 120 mg daily.  Discussed vagal maneuvers.  F/u in 4-6 weeks  Jenie Parish Esther Hardy, MD [facility]. [place] Pager: [phone] Office: [phone]

## 2020-08-23 ENCOUNTER — Other Ambulatory Visit: Payer: Self-pay

## 2020-08-23 ENCOUNTER — Ambulatory Visit: Payer: Medicare Other

## 2020-08-23 DIAGNOSIS — I6522 Occlusion and stenosis of left carotid artery: Secondary | ICD-10-CM

## 2020-08-24 DIAGNOSIS — E785 Hyperlipidemia, unspecified: Secondary | ICD-10-CM | POA: Diagnosis not present

## 2020-08-24 DIAGNOSIS — M1612 Unilateral primary osteoarthritis, left hip: Secondary | ICD-10-CM | POA: Diagnosis not present

## 2020-08-24 DIAGNOSIS — G4700 Insomnia, unspecified: Secondary | ICD-10-CM | POA: Diagnosis not present

## 2020-08-24 DIAGNOSIS — E039 Hypothyroidism, unspecified: Secondary | ICD-10-CM | POA: Diagnosis not present

## 2020-08-24 DIAGNOSIS — I10 Essential (primary) hypertension: Secondary | ICD-10-CM | POA: Diagnosis not present

## 2020-08-24 DIAGNOSIS — K219 Gastro-esophageal reflux disease without esophagitis: Secondary | ICD-10-CM | POA: Diagnosis not present

## 2020-08-24 DIAGNOSIS — I208 Other forms of angina pectoris: Secondary | ICD-10-CM | POA: Diagnosis not present

## 2020-08-26 NOTE — Progress Notes (Signed)
Your ptient, I has an appointment to see you in May.

## 2020-09-04 DIAGNOSIS — H26491 Other secondary cataract, right eye: Secondary | ICD-10-CM | POA: Diagnosis not present

## 2020-09-04 DIAGNOSIS — H35362 Drusen (degenerative) of macula, left eye: Secondary | ICD-10-CM | POA: Diagnosis not present

## 2020-09-19 DIAGNOSIS — E039 Hypothyroidism, unspecified: Secondary | ICD-10-CM | POA: Diagnosis not present

## 2020-09-19 DIAGNOSIS — G4700 Insomnia, unspecified: Secondary | ICD-10-CM | POA: Diagnosis not present

## 2020-09-19 DIAGNOSIS — K219 Gastro-esophageal reflux disease without esophagitis: Secondary | ICD-10-CM | POA: Diagnosis not present

## 2020-09-19 DIAGNOSIS — I208 Other forms of angina pectoris: Secondary | ICD-10-CM | POA: Diagnosis not present

## 2020-09-19 DIAGNOSIS — E785 Hyperlipidemia, unspecified: Secondary | ICD-10-CM | POA: Diagnosis not present

## 2020-09-19 DIAGNOSIS — M1612 Unilateral primary osteoarthritis, left hip: Secondary | ICD-10-CM | POA: Diagnosis not present

## 2020-09-19 DIAGNOSIS — I10 Essential (primary) hypertension: Secondary | ICD-10-CM | POA: Diagnosis not present

## 2020-09-24 ENCOUNTER — Other Ambulatory Visit: Payer: Self-pay

## 2020-09-24 ENCOUNTER — Ambulatory Visit: Payer: Medicare Other | Admitting: Cardiology

## 2020-09-24 ENCOUNTER — Encounter: Payer: Self-pay | Admitting: Cardiology

## 2020-09-24 VITALS — Temp 97.7°F | Resp 17 | Ht 63.0 in | Wt 199.0 lb

## 2020-09-24 DIAGNOSIS — I471 Supraventricular tachycardia: Secondary | ICD-10-CM

## 2020-09-24 DIAGNOSIS — I10 Essential (primary) hypertension: Secondary | ICD-10-CM

## 2020-09-24 DIAGNOSIS — I472 Ventricular tachycardia: Secondary | ICD-10-CM

## 2020-09-24 DIAGNOSIS — I4729 Other ventricular tachycardia: Secondary | ICD-10-CM

## 2020-09-24 NOTE — Progress Notes (Signed)
Follow up visit  Subjective:   Jean King, female    DOB: 27-Jun-1935, 85 y.o.   MRN: [id number]   HPI   Chief Complaint  Patient presents with  . PSVT  . Syncope and collapse  . Hypotension  . Follow-up    10 week    85 year old Caucasian female with hypertension,  hyperlipidemia, hypothyroidism, history of recurrent DVT/PE, HFpEF, ophthalmic migraine, now with PSVT, NSVT  Patient has episodes of "shakiness inside me" form time to time. Sh also reports unchanged exertional dyspnea for a long time. She denies chest pain.   Current Outpatient Medications on File Prior to Visit  Medication Sig Dispense Refill  . acetaminophen (TYLENOL) 500 MG tablet Take 500 mg by mouth every 6 (six) hours as needed.    Marland Kitchen apixaban (ELIQUIS) 5 MG TABS tablet Take 1-2 tablets (5-10 mg total) by mouth 2 (two) times daily. Take 2 tablets (74m) 2 times daily x 7 days, then 1 tablet (527m 2 times daily. (Patient taking differently: Take 5 mg by mouth 2 (two) times daily. Take 2 tablets (1081m2 times daily x 7 days, then 1 tablet (5mg30m times daily.) 70 tablet 0  . calcium-vitamin D (OSCAL WITH D) 500-200 MG-UNIT tablet Take 1 tablet by mouth.    . diltiazem (CARDIZEM CD) 120 MG 24 hr capsule Take 1 capsule (120 mg total) by mouth daily. 30 capsule 3  . escitalopram (LEXAPRO) 20 MG tablet Take 20 mg by mouth daily.     . fluticasone (FLONASE) 50 MCG/ACT nasal spray Place 2 sprays into both nostrils daily.  5  . levothyroxine (SYNTHROID, LEVOTHROID) 75 MCG tablet Take 75 mcg by mouth daily before breakfast.  11  . melatonin 5 MG TABS 10 tablets daily.    . nitroGLYCERIN (NITROSTAT) 0.4 MG SL tablet nitroglycerin 0.4 mg sublingual tablet  DISSOLVE 1 TABLET UNDER THE TONGUE EVERY 5 MINUTES AS NEEDED FOR CHEST PAIN. DO NOT EXCEED A TOTAL OF 3 DOSES IN 15 MINUTES.    . poMarland Kitchenyethylene glycol (MIRALAX / GLYCOLAX) packet Take 17 g by mouth daily as needed.     . rosuvastatin (CRESTOR) 10 MG tablet Take 2  tablets (20 mg total) by mouth daily. 90 tablet 3  . traMADol (ULTRAM) 50 MG tablet Take 50 mg by mouth every 6 (six) hours as needed.    . traZODone (DESYREL) 100 MG tablet Take 50-100 mg by mouth at bedtime as needed for sleep.  2   No current facility-administered medications on file prior to visit.    Cardiovascular & other pertient studies:  Mobile cardiac telemetry 13 days 07/11/2020 - 07/25/2020: Dominant rhythm: Sinus. HR 44-115 bpm. Avg HR 58 bpm, while in sinus rhythm. 36 episodes of SVT, fastest at 169 bpm for 8 beats, longest for 19 beats at 138 bpm. <1% isolated SVE, couplet/triplets. 1 episode of VT at 139 bpm for 6 beats (07/13/2020 3:58 PM) <1% isolated VE, couplet No atrial fibrillation/atrial flutter//high grade AV block, sinus pause >3sec noted. 1 patient triggered events, correlated with sinus rhythm.   EKG 07/11/2020: Sinus rhythm 78 bom Normal EKG  Echocardiogram 01/20/2020:  Left ventricle cavity is normal in size. Moderate concentric hypertrophy  of the left ventricle. Normal global wall motion. Normal LV systolic  function with visual EF 50-55%. Doppler evidence of grade I (impaired)  diastolic dysfunction, normal LAP.  Mild tricuspid regurgitation.  No evidence of pulmonary hypertension.  Compared to previous study on 07/20/2018, left atrial size and  filling  pressures are lower.   Mobile cardiac telemetry 13 days 12/29/2019 - 01/12/2020: Dominant rhythm: Sinus. HR 45-171 bpm. Avg HR 65 bpm. 19 episodes of SVT, fastest at 171 bpm for 10 beats, longest for 16 beats at 123 bpm. <1% SVE burden. Rare PAC/PVC, <1% SVE & VE burden No atrial fibrillation/atrial flutter/SVT/VT/high grade AV block, sinus pause >3sec noted. 1 patient triggered events with PAC.  Carotid artery duplex 09/16/2019:  No evidence of significant stenosis in the right carotid vessels.  Stenosis in the left internal carotid artery (16-49%) with homogeneous  plaque.  Antegrade right  vertebral artery flow. Antegrade left vertebral artery  flow.  Follow up in one year is appropriate if clinically indicated.  Exercise myoview stress 07/16/2018:  1. Lexiscan stress test was performed. Exercise capacity was not assessed. Stress symptoms included dyspnea, dizziness. Resting blood pressure was 134/86 mmHg and peak effect blood pressure was 118/68 mmHg. The resting and stress electrocardiogram demonstrated normal sinus rhythm, normal resting conduction, no arrhythmias and normal repolarization.  Stress EKG is non diagnostic for ischemia as it is a pharmacologic stress.  2. The overall quality of the study is good. There is no evidence of abnormal lung activity. Stress and rest SPECT images demonstrate homogeneous tracer distribution throughout the myocardium. Gated SPECT imaging reveals normal myocardial thickening and wall motion. The left ventricular ejection fraction was normal (65%).   3. Low risk study.   Recent labs: 06/27/2019: Glucose 90, BUN/Cr 22/1.1. EGFR 52 Chol 170, TG 228, HDL 53, LDL 79 TSH 1.3 normal    Review of Systems  Cardiovascular: Positive for dyspnea on exertion (Mild, chronic, stable). Negative for chest pain, leg swelling, palpitations and syncope.        Vitals:   09/24/20 1404 09/24/20 1408  Resp: 17   Temp: 97.7 F (36.5 C)   SpO2: 98% 98%   Orthostatic VS for the past 72 hrs (Last 3 readings):  Orthostatic BP Patient Position BP Location Cuff Size Orthostatic Pulse  09/24/20 1409 120/56 Standing Left Arm Large 84  09/24/20 1408 130/61 Sitting Left Arm Large 73  09/24/20 1404 134/64 Supine Left Arm Large 70      Body mass index is 35.25 kg/m. Filed Weights   09/24/20 1404  Weight: 199 lb (90.3 kg)     Objective:   Physical Exam Vitals and nursing note reviewed.  Constitutional:      Appearance: She is well-developed.  Neck:     Vascular: No JVD.  Cardiovascular:     Rate and Rhythm: Normal rate and regular rhythm.      Pulses: Intact distal pulses.     Heart sounds: Normal heart sounds. No murmur heard.   Pulmonary:     Effort: Pulmonary effort is normal.     Breath sounds: Normal breath sounds. No wheezing or rales.         Assessment & Recommendations:   85 year old Caucasian female with hypertension,  hyperlipidemia, hypothyroidism, history of recurrent DVT/PE, HFpEF, ophthalmic migraine.  Syncope: No recurrence Structurally normal heart on echocardiogram. Cardiac telemetry reviewed episodes of SVT up to 16 beats.    PSVT: I wonder if her "shakiness" symptoms are in fact palpitations. She has not tried vagal maneuvers. Explained the same. If no improvement, will repeat cardiac telemetry and consider AAD vs ablation with EP referral. Continue diltiazem 120 mg daily.  Exertional dyspnea: No heart failure on exam. She has had echocardiogram within a year and stress test in just over two years  for similar symptoms without overt abnormalities. I suspect deconditioning to be the most likely cause.   F/u in 4-6 weeks  Yuridia Couts Esther Hardy, MD [facility] [place] Pager: [phone] Office: [phone]

## 2020-10-14 ENCOUNTER — Other Ambulatory Visit: Payer: Self-pay | Admitting: Cardiology

## 2020-10-14 DIAGNOSIS — I471 Supraventricular tachycardia: Secondary | ICD-10-CM

## 2020-10-14 DIAGNOSIS — R002 Palpitations: Secondary | ICD-10-CM

## 2020-10-19 DIAGNOSIS — I208 Other forms of angina pectoris: Secondary | ICD-10-CM | POA: Diagnosis not present

## 2020-10-19 DIAGNOSIS — I10 Essential (primary) hypertension: Secondary | ICD-10-CM | POA: Diagnosis not present

## 2020-10-19 DIAGNOSIS — E785 Hyperlipidemia, unspecified: Secondary | ICD-10-CM | POA: Diagnosis not present

## 2020-10-19 DIAGNOSIS — E039 Hypothyroidism, unspecified: Secondary | ICD-10-CM | POA: Diagnosis not present

## 2020-10-19 DIAGNOSIS — K219 Gastro-esophageal reflux disease without esophagitis: Secondary | ICD-10-CM | POA: Diagnosis not present

## 2020-10-19 DIAGNOSIS — G4700 Insomnia, unspecified: Secondary | ICD-10-CM | POA: Diagnosis not present

## 2020-10-26 ENCOUNTER — Telehealth: Payer: Medicare Other | Admitting: Cardiology

## 2020-10-26 ENCOUNTER — Other Ambulatory Visit: Payer: Self-pay

## 2020-10-26 ENCOUNTER — Encounter: Payer: Self-pay | Admitting: Cardiology

## 2020-10-26 DIAGNOSIS — R002 Palpitations: Secondary | ICD-10-CM

## 2020-10-26 DIAGNOSIS — I471 Supraventricular tachycardia: Secondary | ICD-10-CM

## 2020-10-26 MED ORDER — DILTIAZEM HCL ER COATED BEADS 120 MG PO CP24: 120.0000 mg | ORAL_CAPSULE | Freq: Every day | ORAL | 3 refills | Status: DC

## 2020-10-26 NOTE — Progress Notes (Signed)
Virtual visit  Subjective:   Jean King, female    DOB: 1935/11/03, 85 y.o.   MRN: [id number]  I connected with the patient on 10/26/2020 by a video enabled telemedicine application and verified that I am speaking with the correct person using two identifiers.     I discussed the limitations of evaluation and management by telemedicine and the availability of in person appointments. The patient expressed understanding and agreed to proceed.   This visit type was conducted due to national recommendations for restrictions regarding the COVID-19 Pandemic (e.g. social distancing).  This format is felt to be most appropriate for this patient at this time.  All issues noted in this document were discussed and addressed.  No physical exam was performed (except for noted visual exam findings with Tele health visits).  The patient has consented to conduct a Tele health visit and understands insurance will be billed.     HPI   Chief Complaint  Patient presents with   PSVT (paroxysmal supraventricular tachycardia)   Follow-up    4-6 week   Syncope and collapse    85 year old Caucasian female with hypertension,  hyperlipidemia, hypothyroidism, history of recurrent DVT/PE, HFpEF, ophthalmic migraine, now with PSVT, NSVT  Patient has only had 2 episodes of heart beat. At night, but much improved compared to prior. She has not had any syncope epispde. She has lost 24 lbs intentionally.    Current Outpatient Medications on File Prior to Visit  Medication Sig Dispense Refill   acetaminophen (TYLENOL) 500 MG tablet Take 500 mg by mouth every 6 (six) hours as needed.     apixaban (ELIQUIS) 5 MG TABS tablet Take 1-2 tablets (5-10 mg total) by mouth 2 (two) times daily. Take 2 tablets (2m) 2 times daily x 7 days, then 1 tablet (575m 2 times daily. (Patient taking differently: Take 5 mg by mouth 2 (two) times daily. Take 2 tablets (1065m2 times daily x 7 days, then 1 tablet (5mg48m times daily.) 70  tablet 0   calcium-vitamin D (OSCAL WITH D) 500-200 MG-UNIT tablet Take 1 tablet by mouth.     diltiazem (CARDIZEM CD) 120 MG 24 hr capsule TAKE ONE CAPSULE BY MOUTH ONCE DAILY 30 capsule 3   escitalopram (LEXAPRO) 20 MG tablet Take 20 mg by mouth daily.      fluticasone (FLONASE) 50 MCG/ACT nasal spray Place 2 sprays into both nostrils daily.  5   levothyroxine (SYNTHROID, LEVOTHROID) 75 MCG tablet Take 75 mcg by mouth daily before breakfast.  11   melatonin 5 MG TABS 10 tablets daily.     nitroGLYCERIN (NITROSTAT) 0.4 MG SL tablet nitroglycerin 0.4 mg sublingual tablet  DISSOLVE 1 TABLET UNDER THE TONGUE EVERY 5 MINUTES AS NEEDED FOR CHEST PAIN. DO NOT EXCEED A TOTAL OF 3 DOSES IN 15 MINUTES.     polyethylene glycol (MIRALAX / GLYCOLAX) packet Take 17 g by mouth daily as needed.      rosuvastatin (CRESTOR) 10 MG tablet Take 2 tablets (20 mg total) by mouth daily. 90 tablet 3   traMADol (ULTRAM) 50 MG tablet Take 50 mg by mouth every 6 (six) hours as needed.     traZODone (DESYREL) 100 MG tablet Take 50-100 mg by mouth at bedtime as needed for sleep.  2   No current facility-administered medications on file prior to visit.    Cardiovascular & other pertient studies:  Mobile cardiac telemetry 13 days 07/11/2020 - 07/25/2020: Dominant rhythm: Sinus. HR 44-115 bpm.  Avg HR 58 bpm, while in sinus rhythm. 36 episodes of SVT, fastest at 169 bpm for 8 beats, longest for 19 beats at 138 bpm. <1% isolated SVE, couplet/triplets. 1 episode of VT at 139 bpm for 6 beats (07/13/2020 3:58 PM) <1% isolated VE, couplet No atrial fibrillation/atrial flutter//high grade AV block, sinus pause >3sec noted. 1 patient triggered events, correlated with sinus rhythm.   EKG 07/11/2020: Sinus rhythm 78 bom Normal EKG  Echocardiogram 01/20/2020:  Left ventricle cavity is normal in size. Moderate concentric hypertrophy  of the left ventricle. Normal global wall motion. Normal LV systolic  function with visual EF  50-55%. Doppler evidence of grade I (impaired)  diastolic dysfunction, normal LAP.  Mild tricuspid regurgitation.  No evidence of pulmonary hypertension.  Compared to previous study on 07/20/2018, left atrial size and filling  pressures are lower.   Mobile cardiac telemetry 13 days 12/29/2019 - 01/12/2020: Dominant rhythm: Sinus. HR 45-171 bpm. Avg HR 65 bpm. 19 episodes of SVT, fastest at 171 bpm for 10 beats, longest for 16 beats at 123 bpm. <1% SVE burden. Rare PAC/PVC, <1% SVE & VE burden No atrial fibrillation/atrial flutter/SVT/VT/high grade AV block, sinus pause >3sec noted. 1 patient triggered events with PAC.   Carotid artery duplex  09/16/2019:  No evidence of significant stenosis in the right carotid vessels.  Stenosis in the left internal carotid artery (16-49%) with homogeneous  plaque.  Antegrade right vertebral artery flow. Antegrade left vertebral artery  flow.  Follow up in one year is appropriate if clinically indicated.  Exercise myoview stress 07/16/2018:  1. Lexiscan stress test was performed. Exercise capacity was not assessed. Stress symptoms included dyspnea, dizziness. Resting blood pressure was 134/86 mmHg and peak effect blood pressure was 118/68 mmHg. The resting and stress electrocardiogram demonstrated normal sinus rhythm, normal resting conduction, no arrhythmias and normal repolarization.  Stress EKG is non diagnostic for ischemia as it is a pharmacologic stress.  2. The overall quality of the study is good. There is no evidence of abnormal lung activity. Stress and rest SPECT images demonstrate homogeneous tracer distribution throughout the myocardium. Gated SPECT imaging reveals normal myocardial thickening and wall motion. The left ventricular ejection fraction was normal (65%).   3. Low risk study.    Recent labs: 06/27/2019: Glucose 90, BUN/Cr 22/1.1. EGFR 52 Chol 170, TG 228, HDL 53, LDL 79 TSH 1.3 normal    ROS      116/68  Vitals:    10/26/20 0924  BP: (!) 150/78  Pulse: 66   Orthostatic VS for the past 72 hrs (Last 3 readings):  Patient Position BP Location Cuff Size  10/26/20 0924 Sitting Left Arm Normal       Body mass index is 35.25 kg/m. Filed Weights   10/26/20 0924  Weight: 199 lb (90.3 kg)     Objective:   Physical Exam Vitals and nursing note reviewed.  Constitutional:      General: She is not in acute distress.    Appearance: She is well-developed.  Pulmonary:     Effort: Pulmonary effort is normal.  Neurological:     Mental Status: She is alert and oriented to person, place, and time.        Assessment & Recommendations:   85 year old Caucasian female with hypertension,  hyperlipidemia, hypothyroidism, history of recurrent DVT/PE, HFpEF, ophthalmic migraine.  Syncope: No recurrence Structurally normal heart on echocardiogram. Cardiac telemetry reviewed episodes of SVT up to 16 beats.    PSVT: Improved with diltiazem  120 mg daily.  F/u in 3 months  [place], MD [facility] [place] Pager: [phone] Office: [phone]

## 2020-11-19 DIAGNOSIS — E039 Hypothyroidism, unspecified: Secondary | ICD-10-CM | POA: Diagnosis not present

## 2020-11-19 DIAGNOSIS — E785 Hyperlipidemia, unspecified: Secondary | ICD-10-CM | POA: Diagnosis not present

## 2020-11-19 DIAGNOSIS — K219 Gastro-esophageal reflux disease without esophagitis: Secondary | ICD-10-CM | POA: Diagnosis not present

## 2020-11-19 DIAGNOSIS — I10 Essential (primary) hypertension: Secondary | ICD-10-CM | POA: Diagnosis not present

## 2020-11-19 DIAGNOSIS — G4700 Insomnia, unspecified: Secondary | ICD-10-CM | POA: Diagnosis not present

## 2020-11-19 DIAGNOSIS — I208 Other forms of angina pectoris: Secondary | ICD-10-CM | POA: Diagnosis not present

## 2020-12-18 DIAGNOSIS — K219 Gastro-esophageal reflux disease without esophagitis: Secondary | ICD-10-CM | POA: Diagnosis not present

## 2020-12-18 DIAGNOSIS — E039 Hypothyroidism, unspecified: Secondary | ICD-10-CM | POA: Diagnosis not present

## 2020-12-18 DIAGNOSIS — I208 Other forms of angina pectoris: Secondary | ICD-10-CM | POA: Diagnosis not present

## 2020-12-18 DIAGNOSIS — G4700 Insomnia, unspecified: Secondary | ICD-10-CM | POA: Diagnosis not present

## 2020-12-18 DIAGNOSIS — M1612 Unilateral primary osteoarthritis, left hip: Secondary | ICD-10-CM | POA: Diagnosis not present

## 2020-12-18 DIAGNOSIS — E785 Hyperlipidemia, unspecified: Secondary | ICD-10-CM | POA: Diagnosis not present

## 2020-12-18 DIAGNOSIS — I10 Essential (primary) hypertension: Secondary | ICD-10-CM | POA: Diagnosis not present

## 2021-01-17 DIAGNOSIS — G4700 Insomnia, unspecified: Secondary | ICD-10-CM | POA: Diagnosis not present

## 2021-01-17 DIAGNOSIS — E039 Hypothyroidism, unspecified: Secondary | ICD-10-CM | POA: Diagnosis not present

## 2021-01-17 DIAGNOSIS — M1612 Unilateral primary osteoarthritis, left hip: Secondary | ICD-10-CM | POA: Diagnosis not present

## 2021-01-17 DIAGNOSIS — I208 Other forms of angina pectoris: Secondary | ICD-10-CM | POA: Diagnosis not present

## 2021-01-17 DIAGNOSIS — K219 Gastro-esophageal reflux disease without esophagitis: Secondary | ICD-10-CM | POA: Diagnosis not present

## 2021-01-17 DIAGNOSIS — E785 Hyperlipidemia, unspecified: Secondary | ICD-10-CM | POA: Diagnosis not present

## 2021-01-17 DIAGNOSIS — I10 Essential (primary) hypertension: Secondary | ICD-10-CM | POA: Diagnosis not present

## 2021-01-30 ENCOUNTER — Ambulatory Visit: Payer: Medicare Other | Admitting: Cardiology

## 2021-01-30 ENCOUNTER — Other Ambulatory Visit: Payer: Self-pay

## 2021-01-30 ENCOUNTER — Encounter: Payer: Self-pay | Admitting: Cardiology

## 2021-01-30 VITALS — BP 114/82 | HR 65 | Temp 97.8°F | Ht 63.0 in | Wt 197.0 lb

## 2021-01-30 DIAGNOSIS — I471 Supraventricular tachycardia: Secondary | ICD-10-CM | POA: Diagnosis not present

## 2021-01-30 DIAGNOSIS — R0609 Other forms of dyspnea: Secondary | ICD-10-CM

## 2021-01-30 DIAGNOSIS — I472 Ventricular tachycardia: Secondary | ICD-10-CM | POA: Diagnosis not present

## 2021-01-30 DIAGNOSIS — I10 Essential (primary) hypertension: Secondary | ICD-10-CM | POA: Diagnosis not present

## 2021-01-30 DIAGNOSIS — R0600 Dyspnea, unspecified: Secondary | ICD-10-CM

## 2021-01-30 DIAGNOSIS — R55 Syncope and collapse: Secondary | ICD-10-CM | POA: Diagnosis not present

## 2021-01-30 DIAGNOSIS — I4729 Other ventricular tachycardia: Secondary | ICD-10-CM

## 2021-01-30 NOTE — Progress Notes (Signed)
Subjective:   Jean King, female    DOB: June 29, 1935, 85 y.o.   MRN: [id number]   HPI   Chief Complaint  Patient presents with   PSVT    Follow-up   Shortness of Breath    85 year old Caucasian female with hypertension,  hyperlipidemia, hypothyroidism, history of recurrent DVT/PE, HFpEF, ophthalmic migraine, now with PSVT, NSVT  Patient with no chest pain, palpitations, syncope, but has had worsening exertional dyspnea with minimal activity of 100 feet walking.  Daughter reports that her dyspnea is similar to prior to her having PE.  On a separate note, patient is planning on moving to [facility] to be with her other daughter next month.  Current Outpatient Medications on File Prior to Visit  Medication Sig Dispense Refill   acetaminophen (TYLENOL) 500 MG tablet Take 500 mg by mouth every 6 (six) hours as needed.     apixaban (ELIQUIS) 5 MG TABS tablet Take 1-2 tablets (5-10 mg total) by mouth 2 (two) times daily. Take 2 tablets (53m) 2 times daily x 7 days, then 1 tablet (538m 2 times daily. (Patient taking differently: Take 5 mg by mouth 2 (two) times daily. Take 2 tablets (1026m2 times daily x 7 days, then 1 tablet (5mg83m times daily.) 70 tablet 0   calcium-vitamin D (OSCAL WITH D) 500-200 MG-UNIT tablet Take 1 tablet by mouth.     diltiazem (CARDIZEM CD) 120 MG 24 hr capsule Take 1 capsule (120 mg total) by mouth daily. 90 capsule 3   escitalopram (LEXAPRO) 20 MG tablet Take 20 mg by mouth daily.      fluticasone (FLONASE) 50 MCG/ACT nasal spray Place 2 sprays into both nostrils daily.  5   levothyroxine (SYNTHROID, LEVOTHROID) 75 MCG tablet Take 75 mcg by mouth daily before breakfast.  11   melatonin 5 MG TABS 10 tablets daily.     nitroGLYCERIN (NITROSTAT) 0.4 MG SL tablet nitroglycerin 0.4 mg sublingual tablet  DISSOLVE 1 TABLET UNDER THE TONGUE EVERY 5 MINUTES AS NEEDED FOR CHEST PAIN. DO NOT EXCEED A TOTAL OF 3 DOSES IN 15 MINUTES.     polyethylene glycol  (MIRALAX / GLYCOLAX) packet Take 17 g by mouth daily as needed.      rosuvastatin (CRESTOR) 10 MG tablet Take 2 tablets (20 mg total) by mouth daily. 90 tablet 3   traMADol (ULTRAM) 50 MG tablet Take 50 mg by mouth every 6 (six) hours as needed.     traZODone (DESYREL) 100 MG tablet Take 50-100 mg by mouth at bedtime as needed for sleep.  2   No current facility-administered medications on file prior to visit.    Cardiovascular & other pertient studies:  EKG 01/30/2021: Sinus rhythm 61 bpm Normal EKG  Mobile cardiac telemetry 13 days 07/11/2020 - 07/25/2020: Dominant rhythm: Sinus. HR 44-115 bpm. Avg HR 58 bpm, while in sinus rhythm. 36 episodes of SVT, fastest at 169 bpm for 8 beats, longest for 19 beats at 138 bpm. <1% isolated SVE, couplet/triplets. 1 episode of VT at 139 bpm for 6 beats (07/13/2020 3:58 PM) <1% isolated VE, couplet No atrial fibrillation/atrial flutter//high grade AV block, sinus pause >3sec noted. 1 patient triggered events, correlated with sinus rhythm.   EKG 07/11/2020: Sinus rhythm 78 bom Normal EKG  Echocardiogram 01/20/2020:  Left ventricle cavity is normal in size. Moderate concentric hypertrophy  of the left ventricle. Normal global wall motion. Normal LV systolic  function with visual EF 50-55%. Doppler evidence of grade  I (impaired)  diastolic dysfunction, normal LAP.  Mild tricuspid regurgitation.  No evidence of pulmonary hypertension.  Compared to previous study on 07/20/2018, left atrial size and filling  pressures are lower.   Mobile cardiac telemetry 13 days 12/29/2019 - 01/12/2020: Dominant rhythm: Sinus. HR 45-171 bpm. Avg HR 65 bpm. 19 episodes of SVT, fastest at 171 bpm for 10 beats, longest for 16 beats at 123 bpm. <1% SVE burden. Rare PAC/PVC, <1% SVE & VE burden No atrial fibrillation/atrial flutter/SVT/VT/high grade AV block, sinus pause >3sec noted. 1 patient triggered events with PAC.   Carotid artery duplex  09/16/2019:  No evidence  of significant stenosis in the right carotid vessels.  Stenosis in the left internal carotid artery (16-49%) with homogeneous  plaque.  Antegrade right vertebral artery flow. Antegrade left vertebral artery  flow.  Follow up in one year is appropriate if clinically indicated.  Exercise myoview stress 07/16/2018:  1. Lexiscan stress test was performed. Exercise capacity was not assessed. Stress symptoms included dyspnea, dizziness. Resting blood pressure was 134/86 mmHg and peak effect blood pressure was 118/68 mmHg. The resting and stress electrocardiogram demonstrated normal sinus rhythm, normal resting conduction, no arrhythmias and normal repolarization.  Stress EKG is non diagnostic for ischemia as it is a pharmacologic stress.  2. The overall quality of the study is good. There is no evidence of abnormal lung activity. Stress and rest SPECT images demonstrate homogeneous tracer distribution throughout the myocardium. Gated SPECT imaging reveals normal myocardial thickening and wall motion. The left ventricular ejection fraction was normal (65%).   3. Low risk study.    Recent labs: 06/27/2019: Glucose 90, BUN/Cr 22/1.1. EGFR 52 Chol 170, TG 228, HDL 53, LDL 79 TSH 1.3 normal    Review of Systems  Cardiovascular:  Positive for dyspnea on exertion. Negative for chest pain, leg swelling, palpitations and syncope.        Vitals:   01/30/21 1114  BP: 114/82  Pulse: 65  Temp: 97.8 F (36.6 C)  SpO2: 98%     Body mass index is 34.9 kg/m. Filed Weights   01/30/21 1114  Weight: 197 lb (89.4 kg)     Objective:   Physical Exam Vitals and nursing note reviewed.  Constitutional:      General: She is not in acute distress.    Appearance: She is well-developed.  Neck:     Vascular: No JVD.  Cardiovascular:     Rate and Rhythm: Normal rate and regular rhythm.     Heart sounds: Normal heart sounds. No murmur heard. Pulmonary:     Effort: Pulmonary effort is normal.      Breath sounds: Normal breath sounds. No wheezing or rales.  Musculoskeletal:     Right lower leg: No edema.     Left lower leg: No edema.  Neurological:     Mental Status: She is alert and oriented to person, place, and time.  Psychiatric:        Mood and Affect: Mood normal.        Assessment & Recommendations:   85 year old Caucasian female with hypertension,  hyperlipidemia, hypothyroidism, history of recurrent DVT/PE, HFpEF, ophthalmic migraine.  Exertional dyspnea: Exam unremarkable for heart failure.  Previously on stress test in 2020.  Given her history of PE and symptoms are similar to her prior episode of PE, I will obtain coronary CT angiogram chest.  We will check labs prior to the same.  PSVT: Improved with diltiazem 120 mg daily.  F/u as  needed.  She will look for another cardiologist emergency room area after her move. Nigel Mormon, MD [facility] Pager: [phone] Office: [phone]

## 2021-01-31 LAB — CBC
Hematocrit: 36.2 % (ref 34.0–46.6)
Hemoglobin: 12 g/dL (ref 11.1–15.9)
MCH: 30.4 pg (ref 26.6–33.0)
MCHC: 33.1 g/dL (ref 31.5–35.7)
MCV: 92 fL (ref 79–97)
Platelets: 255 10*3/uL (ref 150–450)
RBC: 3.95 x10E6/uL (ref 3.77–5.28)
RDW: 12.8 % (ref 11.7–15.4)
WBC: 7.9 10*3/uL (ref 3.4–10.8)

## 2021-01-31 LAB — BASIC METABOLIC PANEL
BUN/Creatinine Ratio: 19 (ref 12–28)
BUN: 21 mg/dL (ref 8–27)
CO2: 24 mmol/L (ref 20–29)
Calcium: 9 mg/dL (ref 8.7–10.3)
Chloride: 105 mmol/L (ref 96–106)
Creatinine, Ser: 1.1 mg/dL — ABNORMAL HIGH (ref 0.57–1.00)
Glucose: 104 mg/dL — ABNORMAL HIGH (ref 70–99)
Potassium: 3.9 mmol/L (ref 3.5–5.2)
Sodium: 144 mmol/L (ref 134–144)
eGFR: 50 mL/min/{1.73_m2} — ABNORMAL LOW (ref >59)

## 2021-02-14 DIAGNOSIS — E039 Hypothyroidism, unspecified: Secondary | ICD-10-CM | POA: Diagnosis not present

## 2021-02-14 DIAGNOSIS — E785 Hyperlipidemia, unspecified: Secondary | ICD-10-CM | POA: Diagnosis not present

## 2021-02-14 DIAGNOSIS — M1612 Unilateral primary osteoarthritis, left hip: Secondary | ICD-10-CM | POA: Diagnosis not present

## 2021-02-14 DIAGNOSIS — G4700 Insomnia, unspecified: Secondary | ICD-10-CM | POA: Diagnosis not present

## 2021-02-14 DIAGNOSIS — I10 Essential (primary) hypertension: Secondary | ICD-10-CM | POA: Diagnosis not present

## 2021-02-14 DIAGNOSIS — K219 Gastro-esophageal reflux disease without esophagitis: Secondary | ICD-10-CM | POA: Diagnosis not present

## 2021-02-14 DIAGNOSIS — I208 Other forms of angina pectoris: Secondary | ICD-10-CM | POA: Diagnosis not present

## 2021-02-19 ENCOUNTER — Ambulatory Visit
Admission: RE | Admit: 2021-02-19 | Discharge: 2021-02-19 | Disposition: A | Discharge status: Home / Self Care | Payer: Medicare Other | Source: Ambulatory Visit | Attending: Cardiology | Admitting: Cardiology

## 2021-02-19 DIAGNOSIS — I2510 Atherosclerotic heart disease of native coronary artery without angina pectoris: Secondary | ICD-10-CM | POA: Diagnosis not present

## 2021-02-19 DIAGNOSIS — I700 Atherosclerosis of aorta: Secondary | ICD-10-CM | POA: Diagnosis not present

## 2021-02-19 DIAGNOSIS — R0609 Other forms of dyspnea: Secondary | ICD-10-CM | POA: Diagnosis not present

## 2021-02-19 DIAGNOSIS — Z86711 Personal history of pulmonary embolism: Secondary | ICD-10-CM | POA: Diagnosis not present

## 2021-02-19 MED ORDER — IOPAMIDOL (ISOVUE-370) INJECTION 76%
75.0000 mL | Freq: Once | INTRAVENOUS | Status: AC | PRN
  Administered 2021-02-19: 75 mL via INTRAVENOUS

## 2021-02-28 ENCOUNTER — Encounter: Payer: Self-pay | Admitting: Cardiology

## 2021-02-28 ENCOUNTER — Ambulatory Visit: Payer: Medicare Other | Admitting: Cardiology

## 2021-02-28 ENCOUNTER — Other Ambulatory Visit: Payer: Self-pay

## 2021-02-28 VITALS — BP 132/59 | HR 84 | Temp 98.0°F | Resp 16 | Ht 63.0 in | Wt 201.0 lb

## 2021-02-28 DIAGNOSIS — I25118 Atherosclerotic heart disease of native coronary artery with other forms of angina pectoris: Secondary | ICD-10-CM | POA: Diagnosis not present

## 2021-02-28 DIAGNOSIS — R0609 Other forms of dyspnea: Secondary | ICD-10-CM | POA: Diagnosis not present

## 2021-02-28 DIAGNOSIS — I209 Angina pectoris, unspecified: Secondary | ICD-10-CM

## 2021-02-28 DIAGNOSIS — I2584 Coronary atherosclerosis due to calcified coronary lesion: Secondary | ICD-10-CM | POA: Diagnosis not present

## 2021-02-28 DIAGNOSIS — I2510 Atherosclerotic heart disease of native coronary artery without angina pectoris: Secondary | ICD-10-CM | POA: Insufficient documentation

## 2021-02-28 MED ORDER — ISOSORBIDE MONONITRATE ER 30 MG PO TB24: 30.0000 mg | ORAL_TABLET | Freq: Every day | ORAL | 3 refills | Status: DC

## 2021-02-28 NOTE — Progress Notes (Signed)
Subjective:   Jean King, female    DOB: [birth year], 85 y.o.   MRN: [id number]   HPI   Chief Complaint  Patient presents with   PSVT (paroxysmal supraventricular tachycardia)   Syncope and collapse   Follow-up    85 year old Caucasian female with hypertension,  hyperlipidemia, hypothyroidism, history of recurrent DVT/PE, HFpEF, ophthalmic migraine, now with PSVT, NSVT  Patient with no chest pain, palpitations, syncope, but has had worsening exertional dyspnea with minimal activity of 100 feet walking.  Daughter reports that her dyspnea is similar to prior to her having PE. However, recent CTA did not show PE. It did show coronary and aortic calcification.  Patient continues to have severe exertional dyspnea with minimal physical activity.  Lately, she has also experienced retrosternal as well as upper back pain after exertion.  Patient is concerned given that she is going to move to [facility] in 2 days from now, to be with her daughter.  Patient tells me that she underwent coronary angiography several years ago in [place] and was told to have "1 blockage".  Current Outpatient Medications on File Prior to Visit  Medication Sig Dispense Refill   acetaminophen (TYLENOL) 500 MG tablet Take 500 mg by mouth every 6 (six) hours as needed.     apixaban (ELIQUIS) 5 MG TABS tablet Take 1-2 tablets (5-10 mg total) by mouth 2 (two) times daily. Take 2 tablets (49m) 2 times daily x 7 days, then 1 tablet (515m 2 times daily. (Patient taking differently: Take 5 mg by mouth 2 (two) times daily. Take 2 tablets (1072m2 times daily x 7 days, then 1 tablet (5mg58m times daily.) 70 tablet 0   calcium-vitamin D (OSCAL WITH D) 500-200 MG-UNIT tablet Take 1 tablet by mouth.     diltiazem (CARDIZEM CD) 120 MG 24 hr capsule Take 1 capsule (120 mg total) by mouth daily. 90 capsule 3   escitalopram (LEXAPRO) 20 MG tablet Take 20 mg by mouth daily.      fluticasone (FLONASE) 50 MCG/ACT nasal spray  Place 2 sprays into both nostrils daily.  5   levothyroxine (SYNTHROID, LEVOTHROID) 75 MCG tablet Take 75 mcg by mouth daily before breakfast.  11   melatonin 5 MG TABS 10 tablets daily.     nitroGLYCERIN (NITROSTAT) 0.4 MG SL tablet nitroglycerin 0.4 mg sublingual tablet  DISSOLVE 1 TABLET UNDER THE TONGUE EVERY 5 MINUTES AS NEEDED FOR CHEST PAIN. DO NOT EXCEED A TOTAL OF 3 DOSES IN 15 MINUTES.     polyethylene glycol (MIRALAX / GLYCOLAX) packet Take 17 g by mouth daily as needed.      rosuvastatin (CRESTOR) 10 MG tablet Take 2 tablets (20 mg total) by mouth daily. 90 tablet 3   traMADol (ULTRAM) 50 MG tablet Take 50 mg by mouth every 6 (six) hours as needed.     traZODone (DESYREL) 100 MG tablet Take 50-100 mg by mouth at bedtime as needed for sleep.  2   No current facility-administered medications on file prior to visit.    Cardiovascular & other pertient studies:  CTA chest 02/19/2021: 1. Negative for acute PE or thoracic aortic dissection. 2. Coronary and Aortic Atherosclerosis (ICD10-170.0).  EKG 01/30/2021: Sinus rhythm 61 bpm Normal EKG  Mobile cardiac telemetry 13 days 07/11/2020 - 07/25/2020: Dominant rhythm: Sinus. HR 44-115 bpm. Avg HR 58 bpm, while in sinus rhythm. 36 episodes of SVT, fastest at 169 bpm for 8 beats, longest for 19 beats at 138 bpm. <  1% isolated SVE, couplet/triplets. 1 episode of VT at 139 bpm for 6 beats (07/13/2020 3:58 PM) <1% isolated VE, couplet No atrial fibrillation/atrial flutter//high grade AV block, sinus pause >3sec noted. 1 patient triggered events, correlated with sinus rhythm.   EKG 07/11/2020: Sinus rhythm 78 bom Normal EKG  Echocardiogram 01/20/2020:  Left ventricle cavity is normal in size. Moderate concentric hypertrophy  of the left ventricle. Normal global wall motion. Normal LV systolic  function with visual EF 50-55%. Doppler evidence of grade I (impaired)  diastolic dysfunction, normal LAP.  Mild tricuspid regurgitation.  No  evidence of pulmonary hypertension.  Compared to previous study on 07/20/2018, left atrial size and filling  pressures are lower.   Mobile cardiac telemetry 13 days 12/29/2019 - 01/12/2020: Dominant rhythm: Sinus. HR 45-171 bpm. Avg HR 65 bpm. 19 episodes of SVT, fastest at 171 bpm for 10 beats, longest for 16 beats at 123 bpm. <1% SVE burden. Rare PAC/PVC, <1% SVE & VE burden No atrial fibrillation/atrial flutter/SVT/VT/high grade AV block, sinus pause >3sec noted. 1 patient triggered events with PAC.   Carotid artery duplex  09/16/2019:  No evidence of significant stenosis in the right carotid vessels.  Stenosis in the left internal carotid artery (16-49%) with homogeneous  plaque.  Antegrade right vertebral artery flow. Antegrade left vertebral artery  flow.  Follow up in one year is appropriate if clinically indicated.  Exercise myoview stress 07/16/2018:  1. Lexiscan stress test was performed. Exercise capacity was not assessed. Stress symptoms included dyspnea, dizziness. Resting blood pressure was 134/86 mmHg and peak effect blood pressure was 118/68 mmHg. The resting and stress electrocardiogram demonstrated normal sinus rhythm, normal resting conduction, no arrhythmias and normal repolarization.  Stress EKG is non diagnostic for ischemia as it is a pharmacologic stress.  2. The overall quality of the study is good. There is no evidence of abnormal lung activity. Stress and rest SPECT images demonstrate homogeneous tracer distribution throughout the myocardium. Gated SPECT imaging reveals normal myocardial thickening and wall motion. The left ventricular ejection fraction was normal (65%).   3. Low risk study.    Recent labs: 06/27/2019: Glucose 90, BUN/Cr 22/1.1. EGFR 52 Chol 170, TG 228, HDL 53, LDL 79 TSH 1.3 normal    Review of Systems  Cardiovascular:  Positive for dyspnea on exertion. Negative for chest pain, leg swelling, palpitations and syncope.        Vitals:    02/28/21 1100  BP: (!) 132/59  Pulse: 84  Resp: 16  Temp: 98 F (36.7 C)  SpO2: 96%      Body mass index is 35.61 kg/m. Filed Weights   02/28/21 1100  Weight: 201 lb (91.2 kg)     Objective:   Physical Exam Vitals and nursing note reviewed.  Constitutional:      General: She is not in acute distress.    Appearance: She is well-developed.  Neck:     Vascular: No JVD.  Cardiovascular:     Rate and Rhythm: Normal rate and regular rhythm.     Heart sounds: Normal heart sounds. No murmur heard. Pulmonary:     Effort: Pulmonary effort is normal.     Breath sounds: Normal breath sounds. No wheezing or rales.  Musculoskeletal:     Right lower leg: No edema.     Left lower leg: No edema.  Neurological:     Mental Status: She is alert and oriented to person, place, and time.  Psychiatric:  Mood and Affect: Mood normal.        Assessment & Recommendations:   85 year old Caucasian female with hypertension,  hyperlipidemia, hypothyroidism, history of recurrent DVT/PE, HFpEF, PSVT, NSVT, now with worsening exertional dyspnea  Exertional dyspnea: Exertional dyspnea with minimal activity.  Physical exam not consistent with acute congestive heart failure.  CT angiogram does not show PE.  It does show aortic and coronary calcification.  She has had a normal stress test in 2020.  However, her symptoms of worsening dyspnea and chest pain are concerning for angina/angina equivalent.  I recommend right and left heart catheterization and coronary angiography for definitive anatomy evaluation as well as potential coronary intervention, if necessary. In the meantime, continue diltiazem 120 mg daily.  Added Imdur 30 mg daily.  Continue statin.  We will check lipid panel.  Further follow-up depending on coronary angiogram and heart catheterization findings.  [facility] Pager: [phone] Office: [phone]

## 2021-03-01 DIAGNOSIS — I209 Angina pectoris, unspecified: Secondary | ICD-10-CM | POA: Diagnosis not present

## 2021-03-02 LAB — CBC
Hematocrit: 37.9 % (ref 34.0–46.6)
Hemoglobin: 12.4 g/dL (ref 11.1–15.9)
MCH: 30 pg (ref 26.6–33.0)
MCHC: 32.7 g/dL (ref 31.5–35.7)
MCV: 92 fL (ref 79–97)
Platelets: 276 10*3/uL (ref 150–450)
RBC: 4.13 x10E6/uL (ref 3.77–5.28)
RDW: 12.4 % (ref 11.7–15.4)
WBC: 7.7 10*3/uL (ref 3.4–10.8)

## 2021-03-02 LAB — LIPID PANEL
Chol/HDL Ratio: 2.4 ratio (ref 0.0–4.4)
Cholesterol, Total: 129 mg/dL (ref 100–199)
HDL: 54 mg/dL (ref >39)
LDL Chol Calc (NIH): 47 mg/dL (ref 0–99)
Triglycerides: 173 mg/dL — ABNORMAL HIGH (ref 0–149)
VLDL Cholesterol Cal: 28 mg/dL (ref 5–40)

## 2021-03-02 LAB — BASIC METABOLIC PANEL
BUN/Creatinine Ratio: 13 (ref 12–28)
BUN: 15 mg/dL (ref 8–27)
CO2: 27 mmol/L (ref 20–29)
Calcium: 9.7 mg/dL (ref 8.7–10.3)
Chloride: 104 mmol/L (ref 96–106)
Creatinine, Ser: 1.2 mg/dL — ABNORMAL HIGH (ref 0.57–1.00)
Glucose: 100 mg/dL — ABNORMAL HIGH (ref 70–99)
Potassium: 3.5 mmol/L (ref 3.5–5.2)
Sodium: 144 mmol/L (ref 134–144)
eGFR: 45 mL/min/{1.73_m2} — ABNORMAL LOW (ref >59)

## 2021-03-05 NOTE — Progress Notes (Signed)
When is her cath scheduled?

## 2021-03-12 ENCOUNTER — Inpatient Hospital Stay (HOSPITAL_COMMUNITY)
Admission: RE | Disposition: A | Discharge status: Home / Self Care | Payer: Self-pay | Source: Home / Self Care | Attending: Cardiology

## 2021-03-12 ENCOUNTER — Other Ambulatory Visit (HOSPITAL_COMMUNITY): Payer: Self-pay

## 2021-03-12 ENCOUNTER — Inpatient Hospital Stay (HOSPITAL_COMMUNITY)
Admission: RE | Admit: 2021-03-12 | Discharge: 2021-03-14 | DRG: 982 | Disposition: A | Discharge status: Home / Self Care | Payer: Medicare Other | Attending: Cardiology | Admitting: Cardiology

## 2021-03-12 ENCOUNTER — Other Ambulatory Visit: Payer: Self-pay | Admitting: Cardiology

## 2021-03-12 ENCOUNTER — Encounter (HOSPITAL_COMMUNITY): Payer: Self-pay | Admitting: Cardiology

## 2021-03-12 DIAGNOSIS — K2210 Ulcer of esophagus without bleeding: Secondary | ICD-10-CM | POA: Diagnosis not present

## 2021-03-12 DIAGNOSIS — K254 Chronic or unspecified gastric ulcer with hemorrhage: Secondary | ICD-10-CM | POA: Diagnosis not present

## 2021-03-12 DIAGNOSIS — D638 Anemia in other chronic diseases classified elsewhere: Secondary | ICD-10-CM | POA: Diagnosis not present

## 2021-03-12 DIAGNOSIS — Z86711 Personal history of pulmonary embolism: Secondary | ICD-10-CM | POA: Diagnosis not present

## 2021-03-12 DIAGNOSIS — Z79890 Hormone replacement therapy (postmenopausal): Secondary | ICD-10-CM | POA: Diagnosis not present

## 2021-03-12 DIAGNOSIS — I951 Orthostatic hypotension: Secondary | ICD-10-CM | POA: Diagnosis not present

## 2021-03-12 DIAGNOSIS — E785 Hyperlipidemia, unspecified: Secondary | ICD-10-CM | POA: Diagnosis not present

## 2021-03-12 DIAGNOSIS — K2211 Ulcer of esophagus with bleeding: Secondary | ICD-10-CM | POA: Diagnosis present

## 2021-03-12 DIAGNOSIS — Z8249 Family history of ischemic heart disease and other diseases of the circulatory system: Secondary | ICD-10-CM

## 2021-03-12 DIAGNOSIS — Z79899 Other long term (current) drug therapy: Secondary | ICD-10-CM | POA: Diagnosis not present

## 2021-03-12 DIAGNOSIS — Z823 Family history of stroke: Secondary | ICD-10-CM | POA: Diagnosis not present

## 2021-03-12 DIAGNOSIS — I5032 Chronic diastolic (congestive) heart failure: Secondary | ICD-10-CM | POA: Diagnosis not present

## 2021-03-12 DIAGNOSIS — D509 Iron deficiency anemia, unspecified: Secondary | ICD-10-CM | POA: Diagnosis not present

## 2021-03-12 DIAGNOSIS — R0609 Other forms of dyspnea: Secondary | ICD-10-CM | POA: Diagnosis present

## 2021-03-12 DIAGNOSIS — E039 Hypothyroidism, unspecified: Secondary | ICD-10-CM | POA: Diagnosis present

## 2021-03-12 DIAGNOSIS — D62 Acute posthemorrhagic anemia: Secondary | ICD-10-CM | POA: Diagnosis not present

## 2021-03-12 DIAGNOSIS — I2510 Atherosclerotic heart disease of native coronary artery without angina pectoris: Secondary | ICD-10-CM | POA: Diagnosis not present

## 2021-03-12 DIAGNOSIS — Z7901 Long term (current) use of anticoagulants: Secondary | ICD-10-CM | POA: Diagnosis not present

## 2021-03-12 DIAGNOSIS — Z86718 Personal history of other venous thrombosis and embolism: Secondary | ICD-10-CM

## 2021-03-12 DIAGNOSIS — Z9861 Coronary angioplasty status: Secondary | ICD-10-CM

## 2021-03-12 DIAGNOSIS — F32A Depression, unspecified: Secondary | ICD-10-CM | POA: Diagnosis present

## 2021-03-12 DIAGNOSIS — Z7902 Long term (current) use of antithrombotics/antiplatelets: Secondary | ICD-10-CM

## 2021-03-12 DIAGNOSIS — I110 Hypertensive heart disease with heart failure: Secondary | ICD-10-CM | POA: Diagnosis not present

## 2021-03-12 DIAGNOSIS — K921 Melena: Secondary | ICD-10-CM

## 2021-03-12 DIAGNOSIS — I25118 Atherosclerotic heart disease of native coronary artery with other forms of angina pectoris: Secondary | ICD-10-CM | POA: Diagnosis not present

## 2021-03-12 DIAGNOSIS — I2584 Coronary atherosclerosis due to calcified coronary lesion: Secondary | ICD-10-CM | POA: Diagnosis not present

## 2021-03-12 DIAGNOSIS — K259 Gastric ulcer, unspecified as acute or chronic, without hemorrhage or perforation: Secondary | ICD-10-CM | POA: Diagnosis not present

## 2021-03-12 DIAGNOSIS — Z20822 Contact with and (suspected) exposure to covid-19: Secondary | ICD-10-CM | POA: Diagnosis not present

## 2021-03-12 HISTORY — PX: INTRAVASCULAR ULTRASOUND/IVUS: CATH118244

## 2021-03-12 HISTORY — PX: CORONARY STENT INTERVENTION: CATH118234

## 2021-03-12 HISTORY — PX: RIGHT/LEFT HEART CATH AND CORONARY ANGIOGRAPHY: CATH118266

## 2021-03-12 HISTORY — PX: INTRAVASCULAR LITHOTRIPSY: CATH118324

## 2021-03-12 HISTORY — PX: INTRAVASCULAR PRESSURE WIRE/FFR STUDY: CATH118243

## 2021-03-12 LAB — POCT ACTIVATED CLOTTING TIME
Activated Clotting Time: 219 seconds
Activated Clotting Time: 468 seconds
Activated Clotting Time: 248 seconds
Activated Clotting Time: 208 seconds
Activated Clotting Time: 185 seconds
Activated Clotting Time: 173 seconds

## 2021-03-12 LAB — POCT I-STAT 7, (LYTES, BLD GAS, ICA,H+H)
Acid-Base Excess: 1 mmol/L (ref 0.0–2.0)
Bicarbonate: 26.9 mmol/L (ref 20.0–28.0)
Calcium, Ion: 1.18 mmol/L (ref 1.15–1.40)
HCT: 29 % — ABNORMAL LOW (ref 36.0–46.0)
Hemoglobin: 9.9 g/dL — ABNORMAL LOW (ref 12.0–15.0)
O2 Saturation: 99 %
Potassium: 3.3 mmol/L — ABNORMAL LOW (ref 3.5–5.1)
Sodium: 144 mmol/L (ref 135–145)
TCO2: 28 mmol/L (ref 22–32)
pCO2 arterial: 49 mmHg — ABNORMAL HIGH (ref 32.0–48.0)
pH, Arterial: 7.347 — ABNORMAL LOW (ref 7.350–7.450)
pO2, Arterial: 124 mmHg — ABNORMAL HIGH (ref 83.0–108.0)

## 2021-03-12 LAB — SARS CORONAVIRUS 2 BY RT PCR (HOSPITAL ORDER, PERFORMED IN ~~LOC~~ HOSPITAL LAB): SARS Coronavirus 2: NEGATIVE

## 2021-03-12 LAB — POCT I-STAT EG7
Acid-base deficit: 1 mmol/L (ref 0.0–2.0)
Bicarbonate: 25.5 mmol/L (ref 20.0–28.0)
Calcium, Ion: 1.19 mmol/L (ref 1.15–1.40)
HCT: 28 % — ABNORMAL LOW (ref 36.0–46.0)
Hemoglobin: 9.5 g/dL — ABNORMAL LOW (ref 12.0–15.0)
O2 Saturation: 73 %
Potassium: 3.2 mmol/L — ABNORMAL LOW (ref 3.5–5.1)
Sodium: 144 mmol/L (ref 135–145)
TCO2: 27 mmol/L (ref 22–32)
pCO2, Ven: 48.9 mmHg (ref 44.0–60.0)
pH, Ven: 7.326 (ref 7.250–7.430)
pO2, Ven: 42 mmHg (ref 32.0–45.0)

## 2021-03-12 SURGERY — RIGHT/LEFT HEART CATH AND CORONARY ANGIOGRAPHY
Anesthesia: LOCAL

## 2021-03-12 MED ORDER — SODIUM CHLORIDE 0.9% FLUSH: 3.0000 mL | INTRAVENOUS | Status: DC | PRN

## 2021-03-12 MED ORDER — MIDAZOLAM HCL 2 MG/2ML IJ SOLN
INTRAMUSCULAR | Status: DC | PRN
  Administered 2021-03-12 (×4): 1 mg via INTRAVENOUS

## 2021-03-12 MED ORDER — VERAPAMIL HCL 2.5 MG/ML IV SOLN
INTRAVENOUS | Status: AC
  Filled 2021-03-12: qty 2

## 2021-03-12 MED ORDER — TRAZODONE HCL 50 MG PO TABS: 50.0000 mg | ORAL_TABLET | Freq: Every evening | ORAL | Status: DC | PRN

## 2021-03-12 MED ORDER — FENTANYL CITRATE (PF) 100 MCG/2ML IJ SOLN
INTRAMUSCULAR | Status: AC
  Filled 2021-03-12: qty 2

## 2021-03-12 MED ORDER — HEPARIN SODIUM (PORCINE) 1000 UNIT/ML IJ SOLN
INTRAMUSCULAR | Status: DC | PRN
  Administered 2021-03-12 (×2): 3000 [IU] via INTRAVENOUS
  Administered 2021-03-12: 7000 [IU] via INTRAVENOUS

## 2021-03-12 MED ORDER — HYDRALAZINE HCL 20 MG/ML IJ SOLN: 10.0000 mg | INTRAMUSCULAR | Status: AC | PRN

## 2021-03-12 MED ORDER — SODIUM CHLORIDE 0.9 % WEIGHT BASED INFUSION
3.0000 mL/kg/h | INTRAVENOUS | Status: AC
  Administered 2021-03-12: 3 mL/kg/h via INTRAVENOUS

## 2021-03-12 MED ORDER — ONDANSETRON HCL 4 MG/2ML IJ SOLN
4.0000 mg | Freq: Four times a day (QID) | INTRAMUSCULAR | Status: DC | PRN
  Administered 2021-03-12: 4 mg via INTRAVENOUS

## 2021-03-12 MED ORDER — LIDOCAINE HCL (PF) 1 % IJ SOLN
INTRAMUSCULAR | Status: AC
  Filled 2021-03-12: qty 30

## 2021-03-12 MED ORDER — ONDANSETRON HCL 4 MG/2ML IJ SOLN
INTRAMUSCULAR | Status: AC
  Filled 2021-03-12: qty 2

## 2021-03-12 MED ORDER — SODIUM CHLORIDE 0.9% FLUSH
3.0000 mL | Freq: Two times a day (BID) | INTRAVENOUS | Status: DC
  Administered 2021-03-14: 3 mL via INTRAVENOUS

## 2021-03-12 MED ORDER — ACETAMINOPHEN ER 650 MG PO TBCR: 650.0000 mg | EXTENDED_RELEASE_TABLET | Freq: Three times a day (TID) | ORAL | Status: DC | PRN

## 2021-03-12 MED ORDER — SODIUM CHLORIDE 0.9% FLUSH: 3.0000 mL | Freq: Two times a day (BID) | INTRAVENOUS | Status: DC

## 2021-03-12 MED ORDER — SODIUM CHLORIDE 0.9 % IV SOLN: 250.0000 mL | INTRAVENOUS | Status: DC | PRN

## 2021-03-12 MED ORDER — POLYETHYLENE GLYCOL 3350 17 G PO PACK: 17.0000 g | PACK | Freq: Every day | ORAL | Status: DC | PRN

## 2021-03-12 MED ORDER — LIDOCAINE HCL (PF) 1 % IJ SOLN
INTRAMUSCULAR | Status: DC | PRN
  Administered 2021-03-12 (×2): 2 mL
  Administered 2021-03-12: 20 mL

## 2021-03-12 MED ORDER — ESCITALOPRAM OXALATE 10 MG PO TABS
20.0000 mg | ORAL_TABLET | Freq: Every day | ORAL | Status: DC
  Administered 2021-03-12 – 2021-03-14 (×3): 20 mg via ORAL
  Filled 2021-03-12 (×2): qty 2
  Filled 2021-03-12 (×2): qty 1
  Filled 2021-03-12: qty 2
  Filled 2021-03-12: qty 1

## 2021-03-12 MED ORDER — IOHEXOL 350 MG/ML SOLN
INTRAVENOUS | Status: DC | PRN
  Administered 2021-03-12: 165 mL via INTRA_ARTERIAL

## 2021-03-12 MED ORDER — FENTANYL CITRATE (PF) 100 MCG/2ML IJ SOLN
INTRAMUSCULAR | Status: DC | PRN
  Administered 2021-03-12 (×2): 50 ug via INTRAVENOUS
  Administered 2021-03-12 (×2): 25 ug via INTRAVENOUS

## 2021-03-12 MED ORDER — MIDAZOLAM HCL 2 MG/2ML IJ SOLN
INTRAMUSCULAR | Status: AC
  Filled 2021-03-12: qty 2

## 2021-03-12 MED ORDER — LEVOTHYROXINE SODIUM 75 MCG PO TABS
75.0000 ug | ORAL_TABLET | Freq: Every day | ORAL | Status: DC
  Administered 2021-03-13 – 2021-03-14 (×2): 75 ug via ORAL
  Filled 2021-03-12 (×2): qty 1

## 2021-03-12 MED ORDER — HEPARIN SODIUM (PORCINE) 1000 UNIT/ML IJ SOLN
INTRAMUSCULAR | Status: AC
  Filled 2021-03-12: qty 1

## 2021-03-12 MED ORDER — MELATONIN 5 MG PO TABS
10.0000 mg | ORAL_TABLET | Freq: Every day | ORAL | Status: DC
  Administered 2021-03-12 – 2021-03-13 (×2): 10 mg via ORAL
  Filled 2021-03-12 (×2): qty 2

## 2021-03-12 MED ORDER — CLOPIDOGREL BISULFATE 75 MG PO TABS
75.0000 mg | ORAL_TABLET | Freq: Every day | ORAL | 1 refills | Status: DC
  Filled 2021-03-12: qty 30, 30d supply, fill #0

## 2021-03-12 MED ORDER — OYSTER SHELL CALCIUM/D3 500-5 MG-MCG PO TABS
2.0000 | ORAL_TABLET | Freq: Every day | ORAL | Status: DC
  Administered 2021-03-13 – 2021-03-14 (×2): 2 via ORAL
  Filled 2021-03-12 (×3): qty 2

## 2021-03-12 MED ORDER — ASPIRIN 81 MG PO CHEW
81.0000 mg | CHEWABLE_TABLET | ORAL | Status: AC
  Administered 2021-03-12: 81 mg via ORAL
  Filled 2021-03-12: qty 1

## 2021-03-12 MED ORDER — DILTIAZEM HCL ER COATED BEADS 120 MG PO CP24
120.0000 mg | ORAL_CAPSULE | Freq: Every day | ORAL | Status: DC
  Administered 2021-03-12 – 2021-03-13 (×2): 120 mg via ORAL
  Filled 2021-03-12 (×2): qty 1

## 2021-03-12 MED ORDER — ROSUVASTATIN CALCIUM 20 MG PO TABS
20.0000 mg | ORAL_TABLET | Freq: Every day | ORAL | Status: DC
  Administered 2021-03-12 – 2021-03-14 (×3): 20 mg via ORAL
  Filled 2021-03-12 (×3): qty 1

## 2021-03-12 MED ORDER — CLOPIDOGREL BISULFATE 75 MG PO TABS
75.0000 mg | ORAL_TABLET | Freq: Every day | ORAL | Status: DC
  Administered 2021-03-13 – 2021-03-14 (×2): 75 mg via ORAL
  Filled 2021-03-12 (×2): qty 1

## 2021-03-12 MED ORDER — LABETALOL HCL 5 MG/ML IV SOLN: 10.0000 mg | INTRAVENOUS | Status: AC | PRN

## 2021-03-12 MED ORDER — HEPARIN (PORCINE) IN NACL 1000-0.9 UT/500ML-% IV SOLN
INTRAVENOUS | Status: DC | PRN
  Administered 2021-03-12 (×4): 500 mL

## 2021-03-12 MED ORDER — SODIUM CHLORIDE 0.9 % WEIGHT BASED INFUSION: 1.0000 mL/kg/h | INTRAVENOUS | Status: DC

## 2021-03-12 MED ORDER — ACETAMINOPHEN 325 MG PO TABS: 650.0000 mg | ORAL_TABLET | ORAL | Status: DC | PRN

## 2021-03-12 MED ORDER — NITROGLYCERIN 1 MG/10 ML FOR IR/CATH LAB
INTRA_ARTERIAL | Status: AC
  Filled 2021-03-12: qty 10

## 2021-03-12 MED ORDER — NITROGLYCERIN 0.4 MG SL SUBL: 0.4000 mg | SUBLINGUAL_TABLET | SUBLINGUAL | Status: DC | PRN

## 2021-03-12 MED ORDER — SODIUM CHLORIDE 0.9 % IV SOLN: INTRAVENOUS | Status: AC

## 2021-03-12 MED ORDER — ACETAMINOPHEN 325 MG PO TABS
650.0000 mg | ORAL_TABLET | ORAL | Status: DC | PRN
  Administered 2021-03-12 – 2021-03-14 (×4): 650 mg via ORAL
  Filled 2021-03-12 (×4): qty 2

## 2021-03-12 MED ORDER — SODIUM CHLORIDE 0.9% FLUSH
3.0000 mL | Freq: Two times a day (BID) | INTRAVENOUS | Status: DC
  Administered 2021-03-12 – 2021-03-14 (×3): 3 mL via INTRAVENOUS

## 2021-03-12 MED ORDER — NITROGLYCERIN 1 MG/10 ML FOR IR/CATH LAB
INTRA_ARTERIAL | Status: DC | PRN
  Administered 2021-03-12: 200 ug via INTRACORONARY

## 2021-03-12 MED ORDER — ONDANSETRON HCL 4 MG/2ML IJ SOLN
4.0000 mg | Freq: Four times a day (QID) | INTRAMUSCULAR | Status: DC | PRN
  Filled 2021-03-12: qty 2

## 2021-03-12 MED ORDER — ASPIRIN 81 MG PO CHEW: 81.0000 mg | CHEWABLE_TABLET | ORAL | Status: DC

## 2021-03-12 MED ORDER — CLOPIDOGREL BISULFATE 300 MG PO TABS
ORAL_TABLET | ORAL | Status: DC | PRN
  Administered 2021-03-12: 600 mg via ORAL

## 2021-03-12 MED ORDER — HEPARIN (PORCINE) IN NACL 1000-0.9 UT/500ML-% IV SOLN
INTRAVENOUS | Status: AC
  Filled 2021-03-12: qty 500

## 2021-03-12 MED ORDER — FLUTICASONE PROPIONATE 50 MCG/ACT NA SUSP: 2.0000 | Freq: Every day | NASAL | Status: DC | PRN

## 2021-03-12 SURGICAL SUPPLY — 30 items
BALLN SAPPHIRE 3.0X15 (BALLOONS) ×3
BALLN SAPPHIRE ~~LOC~~ 3.25X18 (BALLOONS) ×3 IMPLANT
BALLOON SAPPHIRE 3.0X15 (BALLOONS) ×2 IMPLANT
CATH BALLN WEDGE 5F 110CM (CATHETERS) ×3 IMPLANT
CATH INFINITI 5FR MULTPACK ANG (CATHETERS) ×3 IMPLANT
CATH OPTICROSS HD (CATHETERS) ×3 IMPLANT
CATH SHOCKWAVE 2.5X12 (CATHETERS) ×2 IMPLANT
CATH VISTA GUIDE 6FR XBLAD3.5 (CATHETERS) ×3 IMPLANT
CATHETER SHOCKWAVE 2.5X12 (CATHETERS) ×3
COVER SWIFTLINK CONNECTOR (BAG) ×3 IMPLANT
DEVICE RAD COMP TR BAND LRG (VASCULAR PRODUCTS) ×3 IMPLANT
GLIDESHEATH SLEND A-KIT 6F 22G (SHEATH) ×3 IMPLANT
GUIDEWIRE INQWIRE 1.5J.035X260 (WIRE) ×2 IMPLANT
GUIDEWIRE PRESSURE X 175 (WIRE) ×3 IMPLANT
INQWIRE 1.5J .035X260CM (WIRE) ×3
KIT ENCORE 26 ADVANTAGE (KITS) ×3 IMPLANT
KIT HEART LEFT (KITS) ×3 IMPLANT
KIT HEMO VALVE WATCHDOG (MISCELLANEOUS) ×3 IMPLANT
KIT MICROPUNCTURE NIT STIFF (SHEATH) ×3 IMPLANT
PACK CARDIAC CATHETERIZATION (CUSTOM PROCEDURE TRAY) ×3 IMPLANT
SHEATH GLIDE SLENDER 4/5FR (SHEATH) ×3 IMPLANT
SHEATH PINNACLE 5F 10CM (SHEATH) ×3 IMPLANT
SHEATH PINNACLE 6F 10CM (SHEATH) ×3 IMPLANT
SHEATH PROBE COVER 6X72 (BAG) ×3 IMPLANT
SLED PULL BACK IVUS (MISCELLANEOUS) ×3 IMPLANT
STENT ONYX FRONTIER 2.75X34 (Permanent Stent) ×3 IMPLANT
TRANSDUCER W/STOPCOCK (MISCELLANEOUS) ×3 IMPLANT
TUBING CIL FLEX 10 FLL-RA (TUBING) ×3 IMPLANT
WIRE EMERALD 3MM-J .025X260CM (WIRE) ×3 IMPLANT
WIRE EMERALD 3MM-J .035X150CM (WIRE) ×3 IMPLANT

## 2021-03-12 NOTE — H&P (Signed)
OV 02/28/2021 copied for documentation      Subjective:   Jean King, female    DOB: [birth year], 85 y.o.   MRN: [id number]   HPI   Chief Complaint  Patient presents with   PSVT (paroxysmal supraventricular tachycardia)   Syncope and collapse   Follow-up    85 year old Caucasian female with hypertension,  hyperlipidemia, hypothyroidism, history of recurrent DVT/PE, HFpEF, ophthalmic migraine, now with PSVT, NSVT  Patient with no chest pain, palpitations, syncope, but has had worsening exertional dyspnea with minimal activity of 100 feet walking.  Daughter reports that her dyspnea is similar to prior to her having PE. However, recent CTA did not show PE. It did show coronary and aortic calcification.  Patient continues to have severe exertional dyspnea with minimal physical activity.  Lately, she has also experienced retrosternal as well as upper back pain after exertion.  Patient is concerned given that she is going to move to [facility] in 2 days from now, to be with her daughter.  Patient tells me that she underwent coronary angiography several years ago in [place] and was told to have "1 blockage".  Current Outpatient Medications on File Prior to Visit  Medication Sig Dispense Refill   acetaminophen (TYLENOL) 500 MG tablet Take 500 mg by mouth every 6 (six) hours as needed.     apixaban (ELIQUIS) 5 MG TABS tablet Take 1-2 tablets (5-10 mg total) by mouth 2 (two) times daily. Take 2 tablets (70m) 2 times daily x 7 days, then 1 tablet (533m 2 times daily. (Patient taking differently: Take 5 mg by mouth 2 (two) times daily. Take 2 tablets (1050m2 times daily x 7 days, then 1 tablet (5mg60m times daily.) 70 tablet 0   calcium-vitamin D (OSCAL WITH D) 500-200 MG-UNIT tablet Take 1 tablet by mouth.     diltiazem (CARDIZEM CD) 120 MG 24 hr capsule Take 1 capsule (120 mg total) by mouth daily. 90 capsule 3   escitalopram (LEXAPRO) 20 MG tablet Take 20 mg by mouth daily.       fluticasone (FLONASE) 50 MCG/ACT nasal spray Place 2 sprays into both nostrils daily.  5   levothyroxine (SYNTHROID, LEVOTHROID) 75 MCG tablet Take 75 mcg by mouth daily before breakfast.  11   melatonin 5 MG TABS 10 tablets daily.     nitroGLYCERIN (NITROSTAT) 0.4 MG SL tablet nitroglycerin 0.4 mg sublingual tablet  DISSOLVE 1 TABLET UNDER THE TONGUE EVERY 5 MINUTES AS NEEDED FOR CHEST PAIN. DO NOT EXCEED A TOTAL OF 3 DOSES IN 15 MINUTES.     polyethylene glycol (MIRALAX / GLYCOLAX) packet Take 17 g by mouth daily as needed.      rosuvastatin (CRESTOR) 10 MG tablet Take 2 tablets (20 mg total) by mouth daily. 90 tablet 3   traMADol (ULTRAM) 50 MG tablet Take 50 mg by mouth every 6 (six) hours as needed.     traZODone (DESYREL) 100 MG tablet Take 50-100 mg by mouth at bedtime as needed for sleep.  2   No current facility-administered medications on file prior to visit.    Cardiovascular & other pertient studies:  CTA chest 02/19/2021: 1. Negative for acute PE or thoracic aortic dissection. 2. Coronary and Aortic Atherosclerosis (ICD10-170.0).  EKG 01/30/2021: Sinus rhythm 61 bpm Normal EKG  Mobile cardiac telemetry 13 days 07/11/2020 - 07/25/2020: Dominant rhythm: Sinus. HR 44-115 bpm. Avg HR 58 bpm, while in sinus rhythm. 36 episodes of SVT, fastest at 169 bpm for 8 beats,  longest for 19 beats at 138 bpm. <1% isolated SVE, couplet/triplets. 1 episode of VT at 139 bpm for 6 beats (07/13/2020 3:58 PM) <1% isolated VE, couplet No atrial fibrillation/atrial flutter//high grade AV block, sinus pause >3sec noted. 1 patient triggered events, correlated with sinus rhythm.   EKG 07/11/2020: Sinus rhythm 78 bom Normal EKG  Echocardiogram 01/20/2020:  Left ventricle cavity is normal in size. Moderate concentric hypertrophy  of the left ventricle. Normal global wall motion. Normal LV systolic  function with visual EF 50-55%. Doppler evidence of grade I (impaired)  diastolic dysfunction, normal  LAP.  Mild tricuspid regurgitation.  No evidence of pulmonary hypertension.  Compared to previous study on 07/20/2018, left atrial size and filling  pressures are lower.   Mobile cardiac telemetry 13 days 12/29/2019 - 01/12/2020: Dominant rhythm: Sinus. HR 45-171 bpm. Avg HR 65 bpm. 19 episodes of SVT, fastest at 171 bpm for 10 beats, longest for 16 beats at 123 bpm. <1% SVE burden. Rare PAC/PVC, <1% SVE & VE burden No atrial fibrillation/atrial flutter/SVT/VT/high grade AV block, sinus pause >3sec noted. 1 patient triggered events with PAC.   Carotid artery duplex  09/16/2019:  No evidence of significant stenosis in the right carotid vessels.  Stenosis in the left internal carotid artery (16-49%) with homogeneous  plaque.  Antegrade right vertebral artery flow. Antegrade left vertebral artery  flow.  Follow up in one year is appropriate if clinically indicated.  Exercise myoview stress 07/16/2018:  1. Lexiscan stress test was performed. Exercise capacity was not assessed. Stress symptoms included dyspnea, dizziness. Resting blood pressure was 134/86 mmHg and peak effect blood pressure was 118/68 mmHg. The resting and stress electrocardiogram demonstrated normal sinus rhythm, normal resting conduction, no arrhythmias and normal repolarization.  Stress EKG is non diagnostic for ischemia as it is a pharmacologic stress.  2. The overall quality of the study is good. There is no evidence of abnormal lung activity. Stress and rest SPECT images demonstrate homogeneous tracer distribution throughout the myocardium. Gated SPECT imaging reveals normal myocardial thickening and wall motion. The left ventricular ejection fraction was normal (65%).   3. Low risk study.    Recent labs: 06/27/2019: Glucose 90, BUN/Cr 22/1.1. EGFR 52 Chol 170, TG 228, HDL 53, LDL 79 TSH 1.3 normal    Review of Systems  Cardiovascular:  Positive for dyspnea on exertion. Negative for chest pain, leg swelling,  palpitations and syncope.        Vitals:   02/28/21 1100  BP: (!) 132/59  Pulse: 84  Resp: 16  Temp: 98 F (36.7 C)  SpO2: 96%      Body mass index is 35.61 kg/m. Filed Weights   02/28/21 1100  Weight: 201 lb (91.2 kg)     Objective:   Physical Exam Vitals and nursing note reviewed.  Constitutional:      General: She is not in acute distress.    Appearance: She is well-developed.  Neck:     Vascular: No JVD.  Cardiovascular:     Rate and Rhythm: Normal rate and regular rhythm.     Heart sounds: Normal heart sounds. No murmur heard. Pulmonary:     Effort: Pulmonary effort is normal.     Breath sounds: Normal breath sounds. No wheezing or rales.  Musculoskeletal:     Right lower leg: No edema.     Left lower leg: No edema.  Neurological:     Mental Status: She is alert and oriented to person, place, and time.  Psychiatric:        Mood and Affect: Mood normal.        Assessment & Recommendations:   85 year old Caucasian female with hypertension,  hyperlipidemia, hypothyroidism, history of recurrent DVT/PE, HFpEF, PSVT, NSVT, now with worsening exertional dyspnea  Exertional dyspnea: Exertional dyspnea with minimal activity.  Physical exam not consistent with acute congestive heart failure.  CT angiogram does not show PE.  It does show aortic and coronary calcification.  She has had a normal stress test in 2020.  However, her symptoms of worsening dyspnea and chest pain are concerning for angina/angina equivalent.  I recommend right and left heart catheterization and coronary angiography for definitive anatomy evaluation as well as potential coronary intervention, if necessary. In the meantime, continue diltiazem 120 mg daily.  Added Imdur 30 mg daily.  Continue statin.  We will check lipid panel.  Further follow-up depending on coronary angiogram and heart catheterization findings.  [facility] Pager: [phone] Office: [phone]

## 2021-03-12 NOTE — Progress Notes (Signed)
TR BAND REMOVAL  LOCATION:    right radial  DEFLATED PER PROTOCOL:    Yes.    TIME BAND OFF / DRESSING APPLIED:    1730 a clean dry dressing applied with gauze and tegaderm   SITE UPON ARRIVAL:    Level 0  SITE AFTER BAND REMOVAL:    Level 0  CIRCULATION SENSATION AND MOVEMENT:    Within Normal Limits   Yes.    COMMENTS:   Care instruction given to patient

## 2021-03-12 NOTE — Progress Notes (Addendum)
Site area:  Right groin a 6 french arterial sheath was removed by Jeannette Corpus RN   Site Prior to Removal:  Level 0  Pressure Applied For 60 MINUTES    Bedrest Beginning at 1700pm  X4 hours  Manual:   Yes.    Patient Status During Pull:  stable  Post Pull Groin Site:  Level 0  Post Pull Instructions Given:  Yes.    Post Pull Pulses Present:  Yes.    Dressing Applied:  Yes.    Comments:   Pt vagal and Zofran 4 mg IV  and IVF of NS bolus 200 cc given.  Tolerated well  Dr Virgina Jock in to see patient.

## 2021-03-12 NOTE — CV Procedure (Addendum)
Prox-mid LAD eccentric calcific lesion about 75%. RFR 0.81 IVUS guided angiography and intervention >270 degree calcium Shockwave lithotripsy-->DES placement Onyx 2.75X34 mm Excellent results  Unsuccessful radial access Femoral sheath left in place to be removed when ACT appropriate  Full report to follow   Nigel Mormon, MD Pager: [phone] Office: [phone]

## 2021-03-12 NOTE — Interval H&P Note (Signed)
History and Physical Interval Note:  03/12/2021 9:37 AM  Jean King  has presented today for surgery, with the diagnosis of chest pain.  The various methods of treatment have been discussed with the patient and family. After consideration of risks, benefits and other options for treatment, the patient has consented to  Procedure(s): RIGHT/LEFT HEART CATH AND CORONARY ANGIOGRAPHY (N/A) as a surgical intervention.  The patient's history has been reviewed, patient examined, no change in status, stable for surgery.  I have reviewed the patient's chart and labs.  Questions were answered to the patient's satisfaction.    2012 Appropriate Use Criteria for Diagnostic Catheterization Pulmonary Hypertension (Right Heart Catheterization) Indication:  Suspected pulmonary artery hypertension Equivocal or borderline elevated estimated right ventricular systolic pressure on resting echo study A (7) Indication: 97; Score 7  2012 Appropriate Use Criteria for Diagnostic Catheterization Pulmonary Hypertension (Right Heart Catheterization) Indication:  Suspected pulmonary artery hypertension Equivocal or borderline elevated estimated right ventricular systolic pressure on resting echo study A (7) Indication: 97; Score 7   Christropher Gintz J Kasidee Voisin

## 2021-03-12 NOTE — Plan of Care (Signed)
  Problem: Health Behavior/Discharge Planning: Goal: Ability to manage health-related needs will improve Outcome: Progressing   Problem: Clinical Measurements: Goal: Ability to maintain clinical measurements within normal limits will improve Outcome: Progressing   Problem: Clinical Measurements: Goal: Respiratory complications will improve Outcome: Progressing   Problem: Clinical Measurements: Goal: Cardiovascular complication will be avoided Outcome: Progressing   Problem: Pain Managment: Goal: General experience of comfort will improve Outcome: Progressing   Problem: Safety: Goal: Ability to remain free from injury will improve Outcome: Progressing   Problem: Skin Integrity: Goal: Risk for impaired skin integrity will decrease Outcome: Progressing   Problem: Cardiovascular: Goal: Ability to achieve and maintain adequate cardiovascular perfusion will improve Outcome: Progressing   Problem: Cardiovascular: Goal: Vascular access site(s) Level 0-1 will be maintained Outcome: Progressing

## 2021-03-13 ENCOUNTER — Other Ambulatory Visit: Payer: Self-pay

## 2021-03-13 ENCOUNTER — Encounter (HOSPITAL_COMMUNITY): Payer: Self-pay | Admitting: Cardiology

## 2021-03-13 DIAGNOSIS — K921 Melena: Secondary | ICD-10-CM

## 2021-03-13 LAB — BASIC METABOLIC PANEL
Anion gap: 8 (ref 5–15)
BUN: 15 mg/dL (ref 8–23)
CO2: 24 mmol/L (ref 22–32)
Calcium: 8.1 mg/dL — ABNORMAL LOW (ref 8.9–10.3)
Chloride: 107 mmol/L (ref 98–111)
Creatinine, Ser: 1.09 mg/dL — ABNORMAL HIGH (ref 0.44–1.00)
GFR, Estimated: 50 mL/min — ABNORMAL LOW (ref >60)
Glucose, Bld: 138 mg/dL — ABNORMAL HIGH (ref 70–99)
Potassium: 3.6 mmol/L (ref 3.5–5.1)
Sodium: 139 mmol/L (ref 135–145)

## 2021-03-13 LAB — CBC
HCT: 26.3 % — ABNORMAL LOW (ref 36.0–46.0)
Hemoglobin: 8.6 g/dL — ABNORMAL LOW (ref 12.0–15.0)
MCH: 30.7 pg (ref 26.0–34.0)
MCHC: 32.7 g/dL (ref 30.0–36.0)
MCV: 93.9 fL (ref 80.0–100.0)
Platelets: 239 10*3/uL (ref 150–400)
RBC: 2.8 MIL/uL — ABNORMAL LOW (ref 3.87–5.11)
RDW: 13.6 % (ref 11.5–15.5)
WBC: 8.3 10*3/uL (ref 4.0–10.5)
nRBC: 0 % (ref 0.0–0.2)

## 2021-03-13 LAB — HEMOGLOBIN AND HEMATOCRIT, BLOOD
HCT: 24.3 % — ABNORMAL LOW (ref 36.0–46.0)
Hemoglobin: 7.9 g/dL — ABNORMAL LOW (ref 12.0–15.0)

## 2021-03-13 MED ORDER — SODIUM CHLORIDE 0.9 % IV SOLN
INTRAVENOUS | Status: AC
Start: 2021-03-13 — End: 2021-03-14

## 2021-03-13 MED ORDER — SODIUM CHLORIDE 0.9 % IV BOLUS
500.0000 mL | Freq: Once | INTRAVENOUS | Status: AC
Start: 2021-03-13 — End: 2021-03-13
  Administered 2021-03-13: 500 mL via INTRAVENOUS

## 2021-03-13 MED ORDER — APIXABAN 5 MG PO TABS: 5.0000 mg | ORAL_TABLET | Freq: Two times a day (BID) | ORAL | 11 refills | Status: DC

## 2021-03-13 MED ORDER — SODIUM CHLORIDE 0.9 % IV SOLN: INTRAVENOUS | Status: DC

## 2021-03-13 MED ORDER — PANTOPRAZOLE SODIUM 40 MG IV SOLR
40.0000 mg | Freq: Two times a day (BID) | INTRAVENOUS | Status: DC
  Administered 2021-03-13 – 2021-03-14 (×3): 40 mg via INTRAVENOUS
  Filled 2021-03-13 (×3): qty 40

## 2021-03-13 MED FILL — Verapamil HCl IV Soln 2.5 MG/ML: INTRAVENOUS | Qty: 2 | Status: AC

## 2021-03-13 NOTE — Progress Notes (Addendum)
CARDIAC REHAB PHASE I   PRE:  Rate/Rhythm:64 SR    BP: lying 122/56    SaO2: 97 RA  MODE:  Ambulation: 320 ft   POST:  Rate/Rhythm: 86 SR    BP: sitting 98/50 in hall, would not register after walk    SaO2: 98 RA  Orthostatics back in room . . . lying 107/49 Sitting 107/52 Standing pt could not tolerate, once sitting again 107/60  Walked to recliner and BP 92/44 with significant nausea, dizziness, and diaphoresis.  Pt initially ambulating well with RW although did c/o nausea. After 160 ft had to sit and rest due to dizziness and diaphoresis. Attempted walking back to room but had to sit again, BP 98/50. Finished ambulating back to room but had to sit on EOB due to dizziness and diaphoresis. BP would not register. Laid back, checked groin, RN did as well, stable. Took orthostatics (above) but pt could not stand long enough. Ambulated to recliner, BP 92/44, and able to sit up while education done. Instructed pt to sit up for a couple hours and take fluids. She will need orthostatics done again. Daughter present. She will be with her tonight but has to work tomorrow.  Discussed importance of Plavix, bleeding risk, restrictions, exercise, NTG, and CRPII. Pt voiced understanding. Will refer to [facility].  [id number]  [place], ACSM 03/13/2021 11:03 AM

## 2021-03-13 NOTE — Consult Note (Signed)
Referring Provider: Dr. Vernell Leep Primary Care Physician:  Josetta Huddle, MD Primary Gastroenterologist:  Dr. Wynetta Emery  Reason for Consultation:  Melena  HPI: Jean King is a 85 y.o. female with history of hypertension, hyperlipidemia, history of DVT and PE on Eliquis, GERD, dysphagia, recent PCI 03/12/2021 for exertional dyspnea presents to the hospital with postprocedural anemia out of proportion to the procedure.  Patient had DES stent placed proximal mid LAD with Dr. Vernell Leep on 03/13/2021.  Patient began to have drop in hemoglobin out of proportion to procedure. Patient then informed cardiology she been having melena prior to the procedure.  Patient denies ever having an episode like this prior. Patient states has been having melena intermittently for 2 months.  Last episode was 2 days ago prior to coming into the hospital.  Patient denies iron or Pepto use. Patient has longstanding history of reflux, has improved over the last several months, only takes Tums.  Worse with certain foods.  Have some reflux into her throat. Will have intermittent dysphagia with meats and potatoes over the last 1 to 2 months. Patient was having dyspnea and substernal chest discomfort prior to PCI placement. Patient has history of hemorrhoids, denies hematochezia, nausea, vomiting, weight loss.  Patient denies NSAID use. Is on Eliquis outpatient was on hold for PCI procedure, now on Plavix. Patient states she believes she had a colonoscopy age 89, may have had polyps removed but recall was 10 years, reports not viewable the patient believes this is done at [facility]. Denies family history of GI malignancy other than mother with history of pancreatic and liver cancer thought to be mets from other location.     Past Medical History:  Diagnosis Date   Atherosclerosis of left carotid artery    Cataract    Chest pain    Depression    GERD (gastroesophageal reflux disease)    HTN  (hypertension) 06/23/2010   Thyroid disease     Past Surgical History:  Procedure Laterality Date   ABDOMINAL HYSTERECTOMY     CORONARY STENT INTERVENTION N/A 03/12/2021   Procedure: CORONARY STENT INTERVENTION;  Surgeon: Nigel Mormon, MD;  Location: [facility];  Service: Cardiovascular;  Laterality: N/A;   INTRAVASCULAR LITHOTRIPSY  03/12/2021   Procedure: INTRAVASCULAR LITHOTRIPSY;  Surgeon: Nigel Mormon, MD;  Location: [facility];  Service: Cardiovascular;;   INTRAVASCULAR PRESSURE WIRE/FFR STUDY N/A 03/12/2021   Procedure: INTRAVASCULAR PRESSURE WIRE/FFR STUDY;  Surgeon: Nigel Mormon, MD;  Location: [place] [facility];  Service: Cardiovascular;  Laterality: N/A;   INTRAVASCULAR ULTRASOUND/IVUS N/A 03/12/2021   Procedure: Intravascular Ultrasound/IVUS;  Surgeon: Nigel Mormon, MD;  Location: [place] [facility];  Service: Cardiovascular;  Laterality: N/A;   JOINT REPLACEMENT     KNEE ARTHROSCOPY     LIPOMA EXCISION     NOSE SURGERY     RIGHT/LEFT HEART CATH AND CORONARY ANGIOGRAPHY N/A 03/12/2021   Procedure: RIGHT/LEFT HEART CATH AND CORONARY ANGIOGRAPHY;  Surgeon: Nigel Mormon, MD;  Location: [place];  Service: Cardiovascular;  Laterality: N/A;   SPINE SURGERY      Prior to Admission medications   Medication Sig Start Date End Date Taking? Authorizing Provider  acetaminophen (TYLENOL) 650 MG CR tablet Take 650-1,300 mg by mouth every 8 (eight) hours as needed for pain.   Yes [provider]  calcium-vitamin D (OSCAL WITH D) 500-200 MG-UNIT tablet Take 2 tablets by mouth daily with breakfast.   Yes  [provider]  clopidogrel (PLAVIX) 75 MG tablet Take 1 tablet (75 mg total) by mouth daily. 03/12/21 03/12/22 Yes Patwardhan, Manish J, MD  diltiazem (CARDIZEM CD) 120 MG 24 hr capsule Take 1 capsule (120 mg total) by mouth daily. 10/26/20  Yes Patwardhan, Manish J, MD  escitalopram (LEXAPRO) 20 MG tablet Take 20  mg by mouth daily.    Yes [provider]  fluticasone (FLONASE) 50 MCG/ACT nasal spray Place 2 sprays into both nostrils daily as needed for allergies. 01/14/18  Yes [provider]  isosorbide mononitrate (IMDUR) 30 MG 24 hr tablet Take 1 tablet (30 mg total) by mouth daily. 02/28/21 05/29/21 Yes Patwardhan, Reynold Bowen, MD  levothyroxine (SYNTHROID, LEVOTHROID) 75 MCG tablet Take 75 mcg by mouth daily before breakfast. 05/24/15  Yes [provider]  melatonin 5 MG TABS Take 10 mg by mouth at bedtime.   Yes [provider]  nitroGLYCERIN (NITROSTAT) 0.4 MG SL tablet Place 0.4 mg under the tongue every 5 (five) minutes as needed for chest pain.   Yes [provider]  rosuvastatin (CRESTOR) 10 MG tablet Take 2 tablets (20 mg total) by mouth daily. 07/11/20 03/06/21 Yes Patwardhan, Manish J, MD  traMADol (ULTRAM) 50 MG tablet Take 50 mg by mouth every 6 (six) hours as needed for moderate pain.   Yes [provider]  traZODone (DESYREL) 100 MG tablet Take 50 mg by mouth at bedtime as needed for sleep. 12/25/17  Yes [provider]  apixaban (ELIQUIS) 5 MG TABS tablet Take 1 tablet (5 mg total) by mouth 2 (two) times daily. 03/13/21   Patwardhan, Reynold Bowen, MD  polyethylene glycol (MIRALAX / GLYCOLAX) packet Take 17 g by mouth daily as needed for mild constipation or moderate constipation.    [provider]    Scheduled Meds:  calcium-vitamin D  2 tablet Oral Q breakfast   clopidogrel  75 mg Oral Q breakfast   escitalopram  20 mg Oral Daily   levothyroxine  75 mcg Oral QAC breakfast   melatonin  10 mg Oral QHS   pantoprazole (PROTONIX) IV  40 mg Intravenous Q12H   rosuvastatin  20 mg Oral Daily   sodium chloride flush  3 mL Intravenous Q12H   sodium chloride flush  3 mL Intravenous Q12H   Continuous Infusions:  sodium chloride     sodium chloride     sodium chloride     PRN Meds:.sodium chloride, sodium chloride, acetaminophen,  fluticasone, nitroGLYCERIN, ondansetron (ZOFRAN) IV, polyethylene glycol, sodium chloride flush, sodium chloride flush, traZODone  Allergies as of 02/28/2021 - Review Complete 02/28/2021  Allergen Reaction Noted   Morphine Hives 02/15/2018   Morphine and related Hives 02/15/2018   Bupropion  09/25/2020   Chlorhexidine Itching 06/15/2015   Metoprolol tartrate  09/25/2020   Scopolamine Other (See Comments) 06/15/2015    Family History  Problem Relation Age of Onset   Cancer Mother    Heart disease Father    Stroke Father    Heart attack Father    Cancer Sister    Heart disease Brother    Stroke Grandson     Social History   Socioeconomic History   Marital status: Widowed    Spouse name: Not on file   Number of children: 7   Years of education: Not on file   Highest education level: Not on file  Occupational History   Not on file  Tobacco Use   Smoking status: Never   Smokeless  tobacco: Never  Vaping Use   Vaping Use: Never used  Substance and Sexual Activity   Alcohol use: No    Alcohol/week: 0.0 standard drinks   Drug use: Never   Sexual activity: Not on file  Other Topics Concern   Not on file  Social History Narrative   Not on file   Social Determinants of Health   Financial Resource Strain: Not on file  Food Insecurity: Not on file  Transportation Needs: Not on file  Physical Activity: Not on file  Stress: Not on file  Social Connections: Not on file  Intimate Partner Violence: Not on file    Review of Systems:  Review of Systems  Constitutional:  Positive for malaise/fatigue. Negative for chills, fever and weight loss.  Respiratory:  Positive for shortness of breath. Negative for cough.   Cardiovascular:  Positive for chest pain. Negative for leg swelling.  Gastrointestinal:  Positive for constipation, heartburn and melena. Negative for abdominal pain, blood in stool, diarrhea, nausea and vomiting.  Musculoskeletal:  Negative for falls.   Neurological:  Negative for loss of consciousness.  Psychiatric/Behavioral:  Negative for memory loss.    Physical Exam: Vital signs: Vitals:   03/13/21 0000 03/13/21 0440  BP: 117/61 114/60  Pulse: 89 77  Resp: 15 19  Temp:  98.7 F (37.1 C)  SpO2: 95% 97%   Last BM Date: 03/12/21 General:   Alert, in NAD, hard of hearing Heart:  Regular rate and rhythm; no murmurs Pulm: Clear anteriorly; no wheezing Abdomen:  Soft, Obese AB, skin exam normal, Sluggish bowel sounds. mild tenderness  right lower quadrant more so related to ecchymosis from femoral access , otherwise nontender Without guarding and Without rebound, without hepatomegaly. Extremities:  Without edema. Neurologic:  Alert and  oriented x4;  grossly normal neurologically. Psych:  Alert and cooperative. Normal mood and affect.  GI:  Lab Results: Recent Labs    03/12/21 1052 03/13/21 0301  WBC  --  8.3  HGB 9.9*  9.5* 8.6*  HCT 29.0*  28.0* 26.3*  PLT  --  239   BMET Recent Labs    03/12/21 1052 03/13/21 0301  NA 144  144 139  K 3.3*  3.2* 3.6  CL  --  107  CO2  --  24  GLUCOSE  --  138*  BUN  --  15  CREATININE  --  1.09*  CALCIUM  --  8.1*   LFT No results for input(s): PROT, ALBUMIN, AST, ALT, ALKPHOS, BILITOT, BILIDIR, IBILI in the last 72 hours. PT/INR No results for input(s): LABPROT, INR in the last 72 hours.  Studies/Results: CARDIAC CATHETERIZATION  Addendum Date: 03/12/2021     Ost LAD to Prox LAD lesion is 70% stenosed.   Post intervention, there is a 0% residual stenosis. LM: Normal LAD: Prox 70%, mid 80% disease         RFR 0.81, IVUS MLA 1.8 mm2 in mid LAD, 4.5 mm2 in prox LAD Lcx: No significant disease RCA: Mid 20% disease with mild calcification Normal filling pressures No pulmonary hypertension Intravascular ultrasound (IVUS) Shockwave lithotripsy PTCA and stent placement     Onyx Frontier 2.75X34 mm DES     Post dilatation proximally with 3.25 mm [place] balloon at 20 atm 0% residual  stenosis. Excellent stent expansion and apposition MSA 6.1 mm2 Nigel Mormon, MD Pager: [phone] Office: [phone]   Result Date: 03/12/2021 Images from the original result were not included.   Ost LAD to Prox  LAD lesion is 70% stenosed.   Post intervention, there is a 0% residual stenosis. LM: Normal LAD: Prox 70%, mid 80% disease         RFR 0.81, IVUS MLA 1.8 mm2 in mid LAD, 4.5 mm2 in prox LAD Lcx: No significant disease RCA: Mid 20% disease with mild calcification Normal filling pressures No pulmonary hypertension Intravascular ultrasound (IVUS) Shockwave lithotripsy PTCA and stent placement     Onyx Frontier 2.75X34 mm DES     Post dilatation proximally with 3.25 mm [place] balloon at 20 atm 0% residual stenosis. Excellent stent expansion and apposition MSA 6.1 mm2 Nigel Mormon, MD Pager: [phone] Office: [phone]  PERIPHERAL VASCULAR CATHETERIZATION  Addendum Date: 03/12/2021     Ost LAD to Prox LAD lesion is 70% stenosed.   Post intervention, there is a 0% residual stenosis. LM: Normal LAD: Prox 70%, mid 80% disease         RFR 0.81, IVUS MLA 1.8 mm2 in mid LAD, 4.5 mm2 in prox LAD Lcx: No significant disease RCA: Mid 20% disease with mild calcification Normal filling pressures No pulmonary hypertension Intravascular ultrasound (IVUS) Shockwave lithotripsy PTCA and stent placement     Onyx Frontier 2.75X34 mm DES     Post dilatation proximally with 3.25 mm [place] balloon at 20 atm 0% residual stenosis. Excellent stent expansion and apposition MSA 6.1 mm2 Nigel Mormon, MD Pager: [phone] Office: [phone]   Result Date: 03/12/2021 Images from the original result were not included.   Ost LAD to Prox LAD lesion is 70% stenosed.   Post intervention, there is a 0% residual stenosis. LM: Normal LAD: Prox 70%, mid 80% disease         RFR 0.81, IVUS MLA 1.8 mm2 in mid LAD, 4.5 mm2 in prox LAD Lcx: No significant disease RCA: Mid 20% disease with mild calcification Normal  filling pressures No pulmonary hypertension Intravascular ultrasound (IVUS) Shockwave lithotripsy PTCA and stent placement     Onyx Frontier 2.75X34 mm DES     Post dilatation proximally with 3.25 mm [place] balloon at 20 atm 0% residual stenosis. Excellent stent expansion and apposition MSA 6.1 mm2 Nigel Mormon, MD Pager: [phone] Office: [phone]   Impression Melena  Patient is on blood thinners, Eliquis has been on hold for her DVT/PE history.  Currently on plavix for recent PCI. HGB 8.6 (Baseline 12) MCV 93.9 Platelets 239 BUN 15 Cr 1.09 GFR 50  Coronary artery disease s/p PTCA on 03/12/21 History of DVT/PE was on Eliquis Constipation Last colonoscopy 10+ years ago, report not viewable.   Plan Plan for EGD tomorrow while on Plavix. I thoroughly discussed the procedure to include nature, alternatives, benefits, and risks including but not limited to bleeding especially while still on Plavix, perforation, infection, anesthesia/cardiac and pulmonary complications. Patient provides understanding and gave verbal consent to proceed.   Protonix 40 mg IV BID.   Clear liquid diet, NPO at midnight.   Continue daily CBC with transfusion as needed to maintain Hgb >7.   If EGD is unrevealing can consider capsule endoscopy versus colonoscopy.  MiraLAX 17 g twice daily for constipation, Benefiber twice daily.  [facility] will follow.    LOS: 0 days   Vladimir Crofts  PA-C 03/13/2021, 1:04 PM  Contact #  [phone]

## 2021-03-13 NOTE — Progress Notes (Signed)
Orthostatic, Hb 8.5. Hb 12 10 days ago. Anemia out of proportion to peri-procedural blood loss. Patient now reports "dark stools with phlegm" day before her coronary stent procedure on 11/8, but did not think it was important to tell me.   Will keep inpatient today. No urgent need for blood transfusion at this time, but will defer to GI. IV fluids for now.   From coronary standpoint, I am okay to stop plavix 30 days from the procedure on 03/12/2021. Unfortunately, patient is also on eliquis for recurrent DVT/PE which is currently on hold for her coronary procedure on 11/8.  Will get GI consult.   Nigel Mormon, MD Pager: [phone] Office: [phone]

## 2021-03-13 NOTE — Progress Notes (Addendum)
Subjective:  Dizziness Melena  Orthostatic, Hb 8.5. Hb 12 10 days ago. Anemia out of proportion to peri-procedural blood loss. Patient now reports "dark stools with phlegm" day before her coronary stent procedure on 11/8, but did not think it was important to tell me.     Objective:  Vital Signs in the last 24 hours: Temp:  [98 F (36.7 C)-98.7 F (37.1 C)] 98.7 F (37.1 C) (11/09 0440) Pulse Rate:  [55-89] 77 (11/09 0440) Resp:  [10-21] 19 (11/09 0440) BP: (93-174)/(37-121) 114/60 (11/09 0440) SpO2:  [92 %-99 %] 97 % (11/09 0440)  Intake/Output from previous day: 11/08 0701 - 11/09 0700 In: 640 [P.O.:240; I.V.:400] Out: 500 [Urine:500]  Physical Exam Vitals and nursing note reviewed.  Constitutional:      General: She is not in acute distress.    Appearance: She is well-developed.  HENT:     Head: Normocephalic and atraumatic.  Eyes:     Conjunctiva/sclera: Conjunctivae normal.     Pupils: Pupils are equal, round, and reactive to light.  Neck:     Vascular: No JVD.  Cardiovascular:     Rate and Rhythm: Normal rate and regular rhythm.     Pulses: Normal pulses and intact distal pulses.     Heart sounds: No murmur heard. Pulmonary:     Effort: Pulmonary effort is normal.     Breath sounds: Normal breath sounds. No wheezing or rales.  Abdominal:     General: Bowel sounds are normal.     Palpations: Abdomen is soft.     Tenderness: There is no rebound.     Comments: Mild rt groin and RLQ ecchymosis. No discernible hematoma  Musculoskeletal:        General: No tenderness. Normal range of motion.     Right lower leg: No edema.     Left lower leg: No edema.  Lymphadenopathy:     Cervical: No cervical adenopathy.  Skin:    General: Skin is warm and dry.  Neurological:     Mental Status: She is alert and oriented to person, place, and time.     Cranial Nerves: No cranial nerve deficit.     Lab Results: BMP Recent Labs    01/30/21 1204 03/01/21 0909  03/12/21 1052 03/13/21 0301  NA 144 144 144  144 139  K 3.9 3.5 3.3*  3.2* 3.6  CL 105 104  --  107  CO2 24 27  --  24  GLUCOSE 104* 100*  --  138*  BUN 21 15  --  15  CREATININE 1.10* 1.20*  --  1.09*  CALCIUM 9.0 9.7  --  8.1*  GFRNONAA  --   --   --  50*    CBC Recent Labs  Lab 03/13/21 0301  WBC 8.3  RBC 2.80*  HGB 8.6*  HCT 26.3*  PLT 239  MCV 93.9  MCH 30.7  MCHC 32.7  RDW 13.6    HEMOGLOBIN A1C No results found for: HGBA1C, MPG  Cardiac Panel (last 3 results) No results for input(s): CKTOTAL, CKMB, TROPONINI, RELINDX in the last 8760 hours.  BNP (last 3 results) No results for input(s): BNP in the last 8760 hours.  TSH No results for input(s): TSH in the last 8760 hours.  Lipid Panel     Component Value Date/Time   CHOL 129 03/01/2021 0909   TRIG 173 (H) 03/01/2021 0909   HDL 54 03/01/2021 0909   CHOLHDL 2.4 03/01/2021 0909   LDLCALC 47  03/01/2021 0909     Hepatic Function Panel No results for input(s): PROT, ALBUMIN, AST, ALT, ALKPHOS, BILITOT, BILIDIR, IBILI in the last 8760 hours.   Cardiac Studies:  EKG 03/13/2021: Sinus rhythm Prolonged Qtc Otherwise normal EKG   RHC/LHC, coronary intervention 03/12/2021: LM: Normal LAD: Prox 70%, mid 80% disease         RFR 0.81, IVUS MLA 1.8 mm2 in mid LAD, 4.5 mm2 in prox LAD Lcx: No significant disease RCA: Mid 20% disease with mild calcification   Normal filling pressures No pulmonary hypertension   Intravascular ultrasound (IVUS) Shockwave lithotripsy PTCA and stent placement     Onyx [place] 2.75X34 mm DES     Post dilatation proximally with 3.25 mm [place] balloon at 20 atm   0% residual stenosis. Excellent stent expansion and apposition MSA 6.1 mm2  Assessment & Recommendations:  85 year old Caucasian female with hypertension,  hyperlipidemia, hypothyroidism, history of recurrent DVT/PE, HFpEF, PSVT, NSVT, CAD s/p LAD PCI 03/12/2021, now with orthostatic hypotension, melena now  reported that started before PCI  Melena: Suspect GI bleed.Orthostatic, Hb 8.5. Hb 12 10 days ago. Anemia out of proportion to peri-procedural blood loss. Patient now reports "dark stools with phlegm" day before her coronary stent procedure on 11/8, but did not think it was important to tell me.    Will keep inpatient today. No urgent need for blood transfusion at this time, but will defer to GI. IV fluids for now.    From coronary standpoint, I am okay to stop plavix 30 days from the procedure on 03/12/2021. Unfortunately, patient is also on eliquis for recurrent DVT/PE which is currently on hold for her coronary procedure on 11/8.  Appreciate [facility] consult.  Patient now needs inpatient hospitalization.   Nigel Mormon, MD Pager: [phone] Office: [phone]

## 2021-03-13 NOTE — Discharge Summary (Addendum)
Physician Discharge Summary  Patient ID: Jean King MRN: [id number] DOB/AGE: July 02, 1935 85 y.o.  Admit date: 03/12/2021 Discharge date: 03/14/2021  Primary Discharge Diagnosis: Coronary artery disease Acute blood loss anemia Superficial gastric ulcers.   Secondary Discharge Diagnosis: Hypertension Hyperlipidemia H/o DVT/PE PSVT/NSVT  Hospital Course:   85 year old Caucasian female with hypertension,  hyperlipidemia, hypothyroidism, history of recurrent DVT/PE, HFpEF, PSVT, NSVT, now with worsening exertional dyspnea  CTA showed no PE, but showed coronary calcification. No clear cause identified for exertional dyspnea. Currently on two anti anginal agents. Left and right heart catheterization, coronary angiography recommend for definitive evaluation and potential intervention, if necessary.    Patient underwent RFR and IVUS guided PCI to proximal to mid LAD with excellent results, MSA 6.1 mm post PCI. Patient had mild hematoma resolved with manual compression. Superficial ecchymosis remains in right groin and right lower quadrant of abdomen.  However, on the day of anticipated discharge, she was noted to be lightheaded and orthostatic, and was found to have orthostatic hypotension.  Further work-up showed blood loss anemia.  This was out of proportion to mild ecchymosis noted in the right groin.  On further questioning, patient reported having had melena 2 days before her coronary intervention procedure.  Suspecting GI bleeding, I consulted [facility] Dr. Therisa Doyne, who graciously saw the patient and performed EGD-that showed superficial ulcers with no active bleeding.  He was hemoglobin seen was 7.9 g/dL.  Patient did not require blood transfusion.  Gastric biopsy was not performed given ongoing use of Plavix.  H. pylori serology labs were sent.  Per GI, Eliquis could be resumed on 03/15/2021, provided patient is on PPI.  Plavix can be stopped in 30 days post PCI.  Given her recurrent DVT PE and  now GI bleed, I referred her to hematology for further evaluation and to reassess long-term need for Eliquis.  Patient was discharged on 03/14/2021.  On the day of discharge, she was not lightheaded, did not have orthostatic hypotension.  Detailed recommendations were discussed with patient and her daughters at length.  Transition care visit scheduled with me on 03/27/2021.  Discharge Exam: Blood pressure 114/60, pulse 77, temperature 98.7 F (37.1 C), temperature source Oral, resp. rate 19, height _0  (1.6 m), weight 87.1 kg, SpO2 97 %.    Physical Exam Vitals and nursing note reviewed.  Constitutional:      General: She is not in acute distress.    Appearance: She is well-developed.  HENT:     Head: Normocephalic and atraumatic.  Eyes:     Conjunctiva/sclera: Conjunctivae normal.     Pupils: Pupils are equal, round, and reactive to light.  Neck:     Vascular: No JVD.  Cardiovascular:     Rate and Rhythm: Normal rate and regular rhythm.     Pulses: Normal pulses and intact distal pulses.     Heart sounds: No murmur heard. Pulmonary:     Effort: Pulmonary effort is normal.     Breath sounds: Normal breath sounds. No wheezing or rales.  Abdominal:     General: Bowel sounds are normal.     Palpations: Abdomen is soft.     Tenderness: There is no rebound.     Comments: Ecchymosis right groin and RLQ abdomen. No palpable hematoma  Musculoskeletal:        General: No tenderness. Normal range of motion.     Right lower leg: No edema.  Lymphadenopathy:     Cervical: No cervical adenopathy.  Skin:  General: Skin is warm and dry.  Neurological:     Mental Status: She is alert and oriented to person, place, and time.     Cranial Nerves: No cranial nerve deficit.     Significant Diagnostic Studies:  EKG 03/13/2021: Sinus rhythm Prolonged Qtc Otherwise normal EKG  RHC/LHC, coronary intervention 03/12/2021: LM: Normal LAD: Prox 70%, mid 80% disease         RFR 0.81, IVUS  MLA 1.8 mm2 in mid LAD, 4.5 mm2 in prox LAD Lcx: No significant disease RCA: Mid 20% disease with mild calcification   Normal filling pressures No pulmonary hypertension   Intravascular ultrasound (IVUS) Shockwave lithotripsy PTCA and stent placement     Onyx Frontier 2.75X34 mm DES     Post dilatation proximally with 3.25 mm [place] balloon at 20 atm   0% residual stenosis. Excellent stent expansion and apposition MSA 6.1 mm2  Labs:   Lab Results  Component Value Date   WBC 8.3 03/13/2021   HGB 8.6 (L) 03/13/2021   HCT 26.3 (L) 03/13/2021   MCV 93.9 03/13/2021   PLT 239 03/13/2021    Recent Labs  Lab 03/13/21 0301  NA 139  K 3.6  CL 107  CO2 24  BUN 15  CREATININE 1.09*  CALCIUM 8.1*  GLUCOSE 138*    Lipid Panel     Component Value Date/Time   CHOL 129 03/01/2021 0909   TRIG 173 (H) 03/01/2021 0909   HDL 54 03/01/2021 0909   CHOLHDL 2.4 03/01/2021 0909   LDLCALC 47 03/01/2021 0909     Radiology: CT Angio Chest Pulmonary Embolism (PE) W or WO Contrast  Result Date: 02/19/2021 CLINICAL DATA:  Exertional dyspnea, history of pulmonary embolism EXAM: CT ANGIOGRAPHY CHEST WITH CONTRAST TECHNIQUE: Multidetector CT imaging of the chest was performed using the standard protocol during bolus administration of intravenous contrast. Multiplanar CT image reconstructions and MIPs were obtained to evaluate the vascular anatomy. CONTRAST:  66m ISOVUE-370 IOPAMIDOL (ISOVUE-370) INJECTION 76% COMPARISON:  02/15/2018 FINDINGS: Cardiovascular: Heart size normal. No pericardial effusion. Satisfactory opacification of pulmonary arteries noted, and there is no evidence of pulmonary emboli. Scattered coronary calcifications. Adequate contrast opacification of the thoracic aorta with no evidence of dissection, aneurysm, or stenosis. There is classic 3-vessel brachiocephalic arch anatomy without proximal stenosis. Scattered calcified plaque in the arch and descending thoracic segment.  Mediastinum/Nodes: No mass or adenopathy. Lungs/Pleura: No pleural effusion.  Lungs clear. Upper Abdomen: Cholecystectomy clips. Small accessory splenules. No acute findings. Musculoskeletal: Stable T11 compression deformity. Spondylitic changes in the lower cervical spine. No acute fracture or worrisome bone lesion. Review of the MIP images confirms the above findings. IMPRESSION: 1. Negative for acute PE or thoracic aortic dissection. 2. Coronary and Aortic Atherosclerosis (ICD10-170.0). Electronically Signed   By: DLucrezia EuropeM.D.   On: 02/19/2021 12:39   CARDIAC CATHETERIZATION  Addendum Date: 03/12/2021     Ost LAD to Prox LAD lesion is 70% stenosed.   Post intervention, there is a 0% residual stenosis. LM: Normal LAD: Prox 70%, mid 80% disease         RFR 0.81, IVUS MLA 1.8 mm2 in mid LAD, 4.5 mm2 in prox LAD Lcx: No significant disease RCA: Mid 20% disease with mild calcification Normal filling pressures No pulmonary hypertension Intravascular ultrasound (IVUS) Shockwave lithotripsy PTCA and stent placement     Onyx Frontier 2.75X34 mm DES     Post dilatation proximally with 3.25 mm [place] balloon at 20 atm 0% residual stenosis. Excellent  stent expansion and apposition MSA 6.1 mm2 Nigel Mormon, MD Pager: [phone] Office: [phone]   Result Date: 03/12/2021 Images from the original result were not included.   Ost LAD to Prox LAD lesion is 70% stenosed.   Post intervention, there is a 0% residual stenosis. LM: Normal LAD: Prox 70%, mid 80% disease         RFR 0.81, IVUS MLA 1.8 mm2 in mid LAD, 4.5 mm2 in prox LAD Lcx: No significant disease RCA: Mid 20% disease with mild calcification Normal filling pressures No pulmonary hypertension Intravascular ultrasound (IVUS) Shockwave lithotripsy PTCA and stent placement     Onyx Frontier 2.75X34 mm DES     Post dilatation proximally with 3.25 mm [place] balloon at 20 atm 0% residual stenosis. Excellent stent expansion and apposition MSA 6.1 mm2 Nigel Mormon, MD Pager: [phone] Office: [phone]  PERIPHERAL VASCULAR CATHETERIZATION  Addendum Date: 03/12/2021     Ost LAD to Prox LAD lesion is 70% stenosed.   Post intervention, there is a 0% residual stenosis. LM: Normal LAD: Prox 70%, mid 80% disease         RFR 0.81, IVUS MLA 1.8 mm2 in mid LAD, 4.5 mm2 in prox LAD Lcx: No significant disease RCA: Mid 20% disease with mild calcification Normal filling pressures No pulmonary hypertension Intravascular ultrasound (IVUS) Shockwave lithotripsy PTCA and stent placement     Onyx Frontier 2.75X34 mm DES     Post dilatation proximally with 3.25 mm [place] balloon at 20 atm 0% residual stenosis. Excellent stent expansion and apposition MSA 6.1 mm2 Nigel Mormon, MD Pager: [phone] Office: [phone]   Result Date: 03/12/2021 Images from the original result were not included.   Ost LAD to Prox LAD lesion is 70% stenosed.   Post intervention, there is a 0% residual stenosis. LM: Normal LAD: Prox 70%, mid 80% disease         RFR 0.81, IVUS MLA 1.8 mm2 in mid LAD, 4.5 mm2 in prox LAD Lcx: No significant disease RCA: Mid 20% disease with mild calcification Normal filling pressures No pulmonary hypertension Intravascular ultrasound (IVUS) Shockwave lithotripsy PTCA and stent placement     Onyx Frontier 2.75X34 mm DES     Post dilatation proximally with 3.25 mm [place] balloon at 20 atm 0% residual stenosis. Excellent stent expansion and apposition MSA 6.1 mm2 Nigel Mormon, MD Pager: [phone] Office: [phone]     FOLLOW UP PLANS AND APPOINTMENTS Discharge Instructions     AMB Referral to Cardiac Rehabilitation - Phase II   Complete by: As directed    Patient is moving to [facility]. Not sure where she would be doing cardiac rehab.   Diagnosis: Coronary Stents   After initial evaluation and assessments completed: Virtual Based Care may be provided alone or in conjunction with Phase 2 Cardiac Rehab based on patient barriers.: Yes    Diet - low sodium heart healthy   Complete by: As directed    Increase activity slowly   Complete by: As directed       Allergies as of 03/13/2021       Reactions   Morphine Hives   Morphine And Related Hives   Bupropion    Other reaction(s): unknown   Chlorhexidine Itching   Possible reaction to chlorhexidine - Dr. Nelva Bush used to cleanse site prior to cortisone shot (pt not sure if this is correct name of cleanser) 06/15/15   Metoprolol Tartrate    Other reaction(s): syncope   Scopolamine  Other (See Comments)   Jittery, hyperactivity        Medication List     TAKE these medications    acetaminophen 650 MG CR tablet Commonly known as: TYLENOL Take 650-1,300 mg by mouth every 8 (eight) hours as needed for pain.   apixaban 5 MG Tabs tablet Commonly known as: Eliquis Take 1 tablet (5 mg total) by mouth 2 (two) times daily.   calcium-vitamin D 500-200 MG-UNIT tablet Commonly known as: OSCAL WITH D Take 2 tablets by mouth daily with breakfast.   clopidogrel 75 MG tablet Commonly known as: Plavix Take 1 tablet (75 mg total) by mouth daily.   diltiazem 120 MG 24 hr capsule Commonly known as: CARDIZEM CD Take 1 capsule (120 mg total) by mouth daily.   escitalopram 20 MG tablet Commonly known as: LEXAPRO Take 20 mg by mouth daily.   fluticasone 50 MCG/ACT nasal spray Commonly known as: FLONASE Place 2 sprays into both nostrils daily as needed for allergies.   isosorbide mononitrate 30 MG 24 hr tablet Commonly known as: IMDUR Take 1 tablet (30 mg total) by mouth daily.   levothyroxine 75 MCG tablet Commonly known as: SYNTHROID Take 75 mcg by mouth daily before breakfast.   melatonin 5 MG Tabs Take 10 mg by mouth at bedtime.   nitroGLYCERIN 0.4 MG SL tablet Commonly known as: NITROSTAT Place 0.4 mg under the tongue every 5 (five) minutes as needed for chest pain.   polyethylene glycol 17 g packet Commonly known as: MIRALAX / GLYCOLAX Take 17 g by mouth  daily as needed for mild constipation or moderate constipation.   rosuvastatin 10 MG tablet Commonly known as: CRESTOR Take 2 tablets (20 mg total) by mouth daily.   traMADol 50 MG tablet Commonly known as: ULTRAM Take 50 mg by mouth every 6 (six) hours as needed for moderate pain.   traZODone 100 MG tablet Commonly known as: DESYREL Take 50 mg by mouth at bedtime as needed for sleep.           Nigel Mormon, MD Pager: [phone] Office: [phone]

## 2021-03-13 NOTE — Plan of Care (Signed)
  Problem: Health Behavior/Discharge Planning: Goal: Ability to manage health-related needs will improve Outcome: Progressing   Problem: Clinical Measurements: Goal: Ability to maintain clinical measurements within normal limits will improve Outcome: Progressing   Problem: Clinical Measurements: Goal: Cardiovascular complication will be avoided Outcome: Progressing   Problem: Pain Managment: Goal: General experience of comfort will improve Outcome: Progressing   Problem: Safety: Goal: Ability to remain free from injury will improve Outcome: Progressing   Problem: Skin Integrity: Goal: Risk for impaired skin integrity will decrease Outcome: Progressing   Problem: Cardiovascular: Goal: Ability to achieve and maintain adequate cardiovascular perfusion will improve Outcome: Progressing   Problem: Cardiovascular: Goal: Vascular access site(s) Level 0-1 will be maintained Outcome: Progressing

## 2021-03-13 NOTE — H&P (View-Only) (Signed)
Referring Provider: Dr. Vernell Leep Primary Care Physician:  Josetta Huddle, MD Primary Gastroenterologist:  Dr. Wynetta Emery  Reason for Consultation:  Melena  HPI: Jean King is a 85 y.o. female with history of hypertension, hyperlipidemia, history of DVT and PE on Eliquis, GERD, dysphagia, recent PCI 03/12/2021 for exertional dyspnea presents to the hospital with postprocedural anemia out of proportion to the procedure.  Patient had DES stent placed proximal mid LAD with Dr. Vernell Leep on 03/13/2021.  Patient began to have drop in hemoglobin out of proportion to procedure. Patient then informed cardiology she been having melena prior to the procedure.  Patient denies ever having an episode like this prior. Patient states has been having melena intermittently for 2 months.  Last episode was 2 days ago prior to coming into the hospital.  Patient denies iron or Pepto use. Patient has longstanding history of reflux, has improved over the last several months, only takes Tums.  Worse with certain foods.  Have some reflux into her throat. Will have intermittent dysphagia with meats and potatoes over the last 1 to 2 months. Patient was having dyspnea and substernal chest discomfort prior to PCI placement. Patient has history of hemorrhoids, denies hematochezia, nausea, vomiting, weight loss.  Patient denies NSAID use. Is on Eliquis outpatient was on hold for PCI procedure, now on Plavix. Patient states she believes she had a colonoscopy age 11, may have had polyps removed but recall was 10 years, reports not viewable the patient believes this is done at [facility]. Denies family history of GI malignancy other than mother with history of pancreatic and liver cancer thought to be mets from other location.     Past Medical History:  Diagnosis Date   Atherosclerosis of left carotid artery    Cataract    Chest pain    Depression    GERD (gastroesophageal reflux disease)    HTN  (hypertension) 06/23/2010   Thyroid disease     Past Surgical History:  Procedure Laterality Date   ABDOMINAL HYSTERECTOMY     CORONARY STENT INTERVENTION N/A 03/12/2021   Procedure: CORONARY STENT INTERVENTION;  Surgeon: Nigel Mormon, MD;  Location: [facility];  Service: Cardiovascular;  Laterality: N/A;   INTRAVASCULAR LITHOTRIPSY  03/12/2021   Procedure: INTRAVASCULAR LITHOTRIPSY;  Surgeon: Nigel Mormon, MD;  Location: [facility];  Service: Cardiovascular;;   INTRAVASCULAR PRESSURE WIRE/FFR STUDY N/A 03/12/2021   Procedure: INTRAVASCULAR PRESSURE WIRE/FFR STUDY;  Surgeon: Nigel Mormon, MD;  Location: [facility];  Service: Cardiovascular;  Laterality: N/A;   INTRAVASCULAR ULTRASOUND/IVUS N/A 03/12/2021   Procedure: Intravascular Ultrasound/IVUS;  Surgeon: Nigel Mormon, MD;  Location: [facility];  Service: Cardiovascular;  Laterality: N/A;   JOINT REPLACEMENT     KNEE ARTHROSCOPY     LIPOMA EXCISION     NOSE SURGERY     RIGHT/LEFT HEART CATH AND CORONARY ANGIOGRAPHY N/A 03/12/2021   Procedure: RIGHT/LEFT HEART CATH AND CORONARY ANGIOGRAPHY;  Surgeon: Nigel Mormon, MD;  Location: [facility];  Service: Cardiovascular;  Laterality: N/A;   SPINE SURGERY      Prior to Admission medications   Medication Sig Start Date End Date Taking? Authorizing Provider  acetaminophen (TYLENOL) 650 MG CR tablet Take 650-1,300 mg by mouth every 8 (eight) hours as needed for pain.   Yes [provider]  calcium-vitamin D (OSCAL WITH D) 500-200 MG-UNIT tablet Take 2 tablets by mouth daily with breakfast.   Yes  [provider]  clopidogrel (PLAVIX) 75 MG tablet Take 1 tablet (75 mg total) by mouth daily. 03/12/21 03/12/22 Yes Patwardhan, Manish J, MD  diltiazem (CARDIZEM CD) 120 MG 24 hr capsule Take 1 capsule (120 mg total) by mouth daily. 10/26/20  Yes Patwardhan, Manish J, MD  escitalopram (LEXAPRO) 20 MG tablet Take 20  mg by mouth daily.    Yes [provider]  fluticasone (FLONASE) 50 MCG/ACT nasal spray Place 2 sprays into both nostrils daily as needed for allergies. 01/14/18  Yes [provider]  isosorbide mononitrate (IMDUR) 30 MG 24 hr tablet Take 1 tablet (30 mg total) by mouth daily. 02/28/21 05/29/21 Yes Patwardhan, Reynold Bowen, MD  levothyroxine (SYNTHROID, LEVOTHROID) 75 MCG tablet Take 75 mcg by mouth daily before breakfast. 05/24/15  Yes [provider]  melatonin 5 MG TABS Take 10 mg by mouth at bedtime.   Yes [provider]  nitroGLYCERIN (NITROSTAT) 0.4 MG SL tablet Place 0.4 mg under the tongue every 5 (five) minutes as needed for chest pain.   Yes [provider]  rosuvastatin (CRESTOR) 10 MG tablet Take 2 tablets (20 mg total) by mouth daily. 07/11/20 03/06/21 Yes Patwardhan, Manish J, MD  traMADol (ULTRAM) 50 MG tablet Take 50 mg by mouth every 6 (six) hours as needed for moderate pain.   Yes [provider]  traZODone (DESYREL) 100 MG tablet Take 50 mg by mouth at bedtime as needed for sleep. 12/25/17  Yes [provider]  apixaban (ELIQUIS) 5 MG TABS tablet Take 1 tablet (5 mg total) by mouth 2 (two) times daily. 03/13/21   Patwardhan, Reynold Bowen, MD  polyethylene glycol (MIRALAX / GLYCOLAX) packet Take 17 g by mouth daily as needed for mild constipation or moderate constipation.    [provider]    Scheduled Meds:  calcium-vitamin D  2 tablet Oral Q breakfast   clopidogrel  75 mg Oral Q breakfast   escitalopram  20 mg Oral Daily   levothyroxine  75 mcg Oral QAC breakfast   melatonin  10 mg Oral QHS   pantoprazole (PROTONIX) IV  40 mg Intravenous Q12H   rosuvastatin  20 mg Oral Daily   sodium chloride flush  3 mL Intravenous Q12H   sodium chloride flush  3 mL Intravenous Q12H   Continuous Infusions:  sodium chloride     sodium chloride     sodium chloride     PRN Meds:.sodium chloride, sodium chloride, acetaminophen,  fluticasone, nitroGLYCERIN, ondansetron (ZOFRAN) IV, polyethylene glycol, sodium chloride flush, sodium chloride flush, traZODone  Allergies as of 02/28/2021 - Review Complete 02/28/2021  Allergen Reaction Noted   Morphine Hives 02/15/2018   Morphine and related Hives 02/15/2018   Bupropion  09/25/2020   Chlorhexidine Itching 06/15/2015   Metoprolol tartrate  09/25/2020   Scopolamine Other (See Comments) 06/15/2015    Family History  Problem Relation Age of Onset   Cancer Mother    Heart disease Father    Stroke Father    Heart attack Father    Cancer Sister    Heart disease Brother    Stroke Grandson     Social History   Socioeconomic History   Marital status: Widowed    Spouse name: Not on file   Number of children: 7   Years of education: Not on file   Highest education level: Not on file  Occupational History   Not on file  Tobacco Use   Smoking status: Never   Smokeless  tobacco: Never  Vaping Use   Vaping Use: Never used  Substance and Sexual Activity   Alcohol use: No    Alcohol/week: 0.0 standard drinks   Drug use: Never   Sexual activity: Not on file  Other Topics Concern   Not on file  Social History Narrative   Not on file   Social Determinants of Health   Financial Resource Strain: Not on file  Food Insecurity: Not on file  Transportation Needs: Not on file  Physical Activity: Not on file  Stress: Not on file  Social Connections: Not on file  Intimate Partner Violence: Not on file    Review of Systems:  Review of Systems  Constitutional:  Positive for malaise/fatigue. Negative for chills, fever and weight loss.  Respiratory:  Positive for shortness of breath. Negative for cough.   Cardiovascular:  Positive for chest pain. Negative for leg swelling.  Gastrointestinal:  Positive for constipation, heartburn and melena. Negative for abdominal pain, blood in stool, diarrhea, nausea and vomiting.  Musculoskeletal:  Negative for falls.   Neurological:  Negative for loss of consciousness.  Psychiatric/Behavioral:  Negative for memory loss.    Physical Exam: Vital signs: Vitals:   03/13/21 0000 03/13/21 0440  BP: 117/61 114/60  Pulse: 89 77  Resp: 15 19  Temp:  98.7 F (37.1 C)  SpO2: 95% 97%   Last BM Date: 03/12/21 General:   Alert, in NAD, hard of hearing Heart:  Regular rate and rhythm; no murmurs Pulm: Clear anteriorly; no wheezing Abdomen:  Soft, Obese AB, skin exam normal, Sluggish bowel sounds. mild tenderness  right lower quadrant more so related to ecchymosis from femoral access , otherwise nontender Without guarding and Without rebound, without hepatomegaly. Extremities:  Without edema. Neurologic:  Alert and  oriented x4;  grossly normal neurologically. Psych:  Alert and cooperative. Normal mood and affect.  GI:  Lab Results: Recent Labs    03/12/21 1052 03/13/21 0301  WBC  --  8.3  HGB 9.9*  9.5* 8.6*  HCT 29.0*  28.0* 26.3*  PLT  --  239   BMET Recent Labs    03/12/21 1052 03/13/21 0301  NA 144  144 139  K 3.3*  3.2* 3.6  CL  --  107  CO2  --  24  GLUCOSE  --  138*  BUN  --  15  CREATININE  --  1.09*  CALCIUM  --  8.1*   LFT No results for input(s): PROT, ALBUMIN, AST, ALT, ALKPHOS, BILITOT, BILIDIR, IBILI in the last 72 hours. PT/INR No results for input(s): LABPROT, INR in the last 72 hours.  Studies/Results: CARDIAC CATHETERIZATION  Addendum Date: 03/12/2021     Ost LAD to Prox LAD lesion is 70% stenosed.   Post intervention, there is a 0% residual stenosis. LM: Normal LAD: Prox 70%, mid 80% disease         RFR 0.81, IVUS MLA 1.8 mm2 in mid LAD, 4.5 mm2 in prox LAD Lcx: No significant disease RCA: Mid 20% disease with mild calcification Normal filling pressures No pulmonary hypertension Intravascular ultrasound (IVUS) Shockwave lithotripsy PTCA and stent placement     Onyx Frontier 2.75X34 mm DES     Post dilatation proximally with 3.25 mm [place] balloon at 20 atm 0% residual  stenosis. Excellent stent expansion and apposition MSA 6.1 mm2 Nigel Mormon, MD Pager: [phone] Office: [phone]   Result Date: 03/12/2021 Images from the original result were not included.   Ost LAD to Prox  LAD lesion is 70% stenosed.   Post intervention, there is a 0% residual stenosis. LM: Normal LAD: Prox 70%, mid 80% disease         RFR 0.81, IVUS MLA 1.8 mm2 in mid LAD, 4.5 mm2 in prox LAD Lcx: No significant disease RCA: Mid 20% disease with mild calcification Normal filling pressures No pulmonary hypertension Intravascular ultrasound (IVUS) Shockwave lithotripsy PTCA and stent placement     Onyx Frontier 2.75X34 mm DES     Post dilatation proximally with 3.25 mm [place] balloon at 20 atm 0% residual stenosis. Excellent stent expansion and apposition MSA 6.1 mm2 Nigel Mormon, MD Pager: [phone] Office: [phone]  PERIPHERAL VASCULAR CATHETERIZATION  Addendum Date: 03/12/2021     Ost LAD to Prox LAD lesion is 70% stenosed.   Post intervention, there is a 0% residual stenosis. LM: Normal LAD: Prox 70%, mid 80% disease         RFR 0.81, IVUS MLA 1.8 mm2 in mid LAD, 4.5 mm2 in prox LAD Lcx: No significant disease RCA: Mid 20% disease with mild calcification Normal filling pressures No pulmonary hypertension Intravascular ultrasound (IVUS) Shockwave lithotripsy PTCA and stent placement     Onyx Frontier 2.75X34 mm DES     Post dilatation proximally with 3.25 mm [place] balloon at 20 atm 0% residual stenosis. Excellent stent expansion and apposition MSA 6.1 mm2 Nigel Mormon, MD Pager: [phone] Office: [phone]   Result Date: 03/12/2021 Images from the original result were not included.   Ost LAD to Prox LAD lesion is 70% stenosed.   Post intervention, there is a 0% residual stenosis. LM: Normal LAD: Prox 70%, mid 80% disease         RFR 0.81, IVUS MLA 1.8 mm2 in mid LAD, 4.5 mm2 in prox LAD Lcx: No significant disease RCA: Mid 20% disease with mild calcification Normal  filling pressures No pulmonary hypertension Intravascular ultrasound (IVUS) Shockwave lithotripsy PTCA and stent placement     Onyx Frontier 2.75X34 mm DES     Post dilatation proximally with 3.25 mm [place] balloon at 20 atm 0% residual stenosis. Excellent stent expansion and apposition MSA 6.1 mm2 Nigel Mormon, MD Pager: [phone] Office: [phone]   Impression Melena  Patient is on blood thinners, Eliquis has been on hold for her DVT/PE history.  Currently on plavix for recent PCI. HGB 8.6 (Baseline 12) MCV 93.9 Platelets 239 BUN 15 Cr 1.09 GFR 50  Coronary artery disease s/p PTCA on 03/12/21 History of DVT/PE was on Eliquis Constipation Last colonoscopy 10+ years ago, report not viewable.   Plan Plan for EGD tomorrow while on Plavix. I thoroughly discussed the procedure to include nature, alternatives, benefits, and risks including but not limited to bleeding especially while still on Plavix, perforation, infection, anesthesia/cardiac and pulmonary complications. Patient provides understanding and gave verbal consent to proceed.   Protonix 40 mg IV BID.   Clear liquid diet, NPO at midnight.   Continue daily CBC with transfusion as needed to maintain Hgb >7.   If EGD is unrevealing can consider capsule endoscopy versus colonoscopy.  MiraLAX 17 g twice daily for constipation, Benefiber twice daily.  [facility] will follow.    LOS: 0 days   Vladimir Crofts  PA-C 03/13/2021, 1:04 PM  Contact #  [phone]

## 2021-03-14 ENCOUNTER — Other Ambulatory Visit: Payer: Self-pay | Admitting: Cardiology

## 2021-03-14 ENCOUNTER — Encounter (HOSPITAL_COMMUNITY)
Admission: RE | Disposition: A | Discharge status: Home / Self Care | Payer: Self-pay | Source: Home / Self Care | Attending: Cardiology

## 2021-03-14 ENCOUNTER — Telehealth: Payer: Self-pay | Admitting: Hematology and Oncology

## 2021-03-14 ENCOUNTER — Telehealth (HOSPITAL_COMMUNITY): Payer: Self-pay

## 2021-03-14 ENCOUNTER — Ambulatory Visit (HOSPITAL_COMMUNITY): Payer: Medicare Other | Admitting: Anesthesiology

## 2021-03-14 ENCOUNTER — Other Ambulatory Visit (HOSPITAL_COMMUNITY): Payer: Self-pay

## 2021-03-14 ENCOUNTER — Encounter (HOSPITAL_COMMUNITY): Payer: Self-pay | Admitting: Cardiology

## 2021-03-14 DIAGNOSIS — Z823 Family history of stroke: Secondary | ICD-10-CM | POA: Diagnosis not present

## 2021-03-14 DIAGNOSIS — Z86718 Personal history of other venous thrombosis and embolism: Secondary | ICD-10-CM | POA: Diagnosis not present

## 2021-03-14 DIAGNOSIS — E039 Hypothyroidism, unspecified: Secondary | ICD-10-CM | POA: Diagnosis present

## 2021-03-14 DIAGNOSIS — Z79899 Other long term (current) drug therapy: Secondary | ICD-10-CM | POA: Diagnosis not present

## 2021-03-14 DIAGNOSIS — I951 Orthostatic hypotension: Secondary | ICD-10-CM | POA: Diagnosis present

## 2021-03-14 DIAGNOSIS — K2211 Ulcer of esophagus with bleeding: Secondary | ICD-10-CM | POA: Diagnosis present

## 2021-03-14 DIAGNOSIS — D62 Acute posthemorrhagic anemia: Secondary | ICD-10-CM | POA: Diagnosis present

## 2021-03-14 DIAGNOSIS — K921 Melena: Secondary | ICD-10-CM | POA: Diagnosis present

## 2021-03-14 DIAGNOSIS — K254 Chronic or unspecified gastric ulcer with hemorrhage: Secondary | ICD-10-CM | POA: Diagnosis present

## 2021-03-14 DIAGNOSIS — I2510 Atherosclerotic heart disease of native coronary artery without angina pectoris: Secondary | ICD-10-CM | POA: Diagnosis present

## 2021-03-14 DIAGNOSIS — Z7901 Long term (current) use of anticoagulants: Secondary | ICD-10-CM | POA: Diagnosis not present

## 2021-03-14 DIAGNOSIS — Z7902 Long term (current) use of antithrombotics/antiplatelets: Secondary | ICD-10-CM | POA: Diagnosis not present

## 2021-03-14 DIAGNOSIS — Z86711 Personal history of pulmonary embolism: Secondary | ICD-10-CM | POA: Diagnosis not present

## 2021-03-14 DIAGNOSIS — Z8249 Family history of ischemic heart disease and other diseases of the circulatory system: Secondary | ICD-10-CM | POA: Diagnosis not present

## 2021-03-14 DIAGNOSIS — I5032 Chronic diastolic (congestive) heart failure: Secondary | ICD-10-CM | POA: Diagnosis present

## 2021-03-14 DIAGNOSIS — Z20822 Contact with and (suspected) exposure to covid-19: Secondary | ICD-10-CM | POA: Diagnosis present

## 2021-03-14 DIAGNOSIS — Z79890 Hormone replacement therapy (postmenopausal): Secondary | ICD-10-CM | POA: Diagnosis not present

## 2021-03-14 DIAGNOSIS — E785 Hyperlipidemia, unspecified: Secondary | ICD-10-CM | POA: Diagnosis present

## 2021-03-14 DIAGNOSIS — F32A Depression, unspecified: Secondary | ICD-10-CM | POA: Diagnosis present

## 2021-03-14 DIAGNOSIS — I110 Hypertensive heart disease with heart failure: Secondary | ICD-10-CM | POA: Diagnosis present

## 2021-03-14 HISTORY — PX: ESOPHAGOGASTRODUODENOSCOPY (EGD) WITH PROPOFOL: SHX5813

## 2021-03-14 SURGERY — ESOPHAGOGASTRODUODENOSCOPY (EGD) WITH PROPOFOL
Anesthesia: Monitor Anesthesia Care

## 2021-03-14 MED ORDER — LACTATED RINGERS IV SOLN: INTRAVENOUS | Status: DC | PRN

## 2021-03-14 MED ORDER — PROPOFOL 10 MG/ML IV BOLUS
INTRAVENOUS | Status: DC | PRN
  Administered 2021-03-14: 20 mg via INTRAVENOUS

## 2021-03-14 MED ORDER — PANTOPRAZOLE SODIUM 40 MG PO TBEC
40.0000 mg | DELAYED_RELEASE_TABLET | Freq: Every day | ORAL | 2 refills | Status: AC
  Filled 2021-03-14: qty 30, 30d supply, fill #0

## 2021-03-14 MED ORDER — POLYETHYLENE GLYCOL 3350 17 GM/SCOOP PO POWD
17.0000 g | Freq: Every day | ORAL | 1 refills | Status: AC | PRN
  Filled 2021-03-14: qty 238, 14d supply, fill #0

## 2021-03-14 MED ORDER — PROPOFOL 500 MG/50ML IV EMUL
INTRAVENOUS | Status: DC | PRN
  Administered 2021-03-14: 150 ug/kg/min via INTRAVENOUS

## 2021-03-14 MED ORDER — LIDOCAINE HCL (CARDIAC) PF 100 MG/5ML IV SOSY
PREFILLED_SYRINGE | INTRAVENOUS | Status: DC | PRN
Start: 2021-03-14 — End: 2021-03-14
  Administered 2021-03-14: 60 mg via INTRAVENOUS

## 2021-03-14 MED ORDER — APIXABAN 5 MG PO TABS: 5.0000 mg | ORAL_TABLET | Freq: Two times a day (BID) | ORAL | 11 refills | Status: DC

## 2021-03-14 SURGICAL SUPPLY — 15 items

## 2021-03-14 NOTE — Telephone Encounter (Signed)
Per phase I cardiac rehab, fax cardiac rehab referral to [facility] cardiac rehab.

## 2021-03-14 NOTE — Progress Notes (Signed)
Pt discharged to home with her daughter. Orthostatic vitals were taken prior to d/c. Pt and family were informed about follow up appointments and medications. Family verbalized of understanding.

## 2021-03-14 NOTE — Progress Notes (Signed)
Subjective:  No new complaints today. No BM. Plan for EGD today.  Objective:  Vital Signs in the last 24 hours: Temp:  [99.4 F (37.4 C)-99.5 F (37.5 C)] 99.5 F (37.5 C) (11/10 0507) Pulse Rate:  [66-71] 71 (11/10 0507) Resp:  [13-19] 16 (11/10 0507) BP: (96-122)/(43-51) 122/51 (11/10 0507) SpO2:  [97 %-98 %] 98 % (11/10 0507)  Intake/Output from previous day: 11/09 0701 - 11/10 0700 In: 1357.1 [P.O.:240; I.V.:617.9; IV Piggyback:499.2] Out: 600 [Urine:600]  Physical Exam Vitals and nursing note reviewed.  Constitutional:      General: She is not in acute distress.    Appearance: She is well-developed.  HENT:     Head: Normocephalic and atraumatic.  Eyes:     Conjunctiva/sclera: Conjunctivae normal.     Pupils: Pupils are equal, round, and reactive to light.  Neck:     Vascular: No JVD.  Cardiovascular:     Rate and Rhythm: Normal rate and regular rhythm.     Pulses: Normal pulses and intact distal pulses.     Heart sounds: No murmur heard. Pulmonary:     Effort: Pulmonary effort is normal.     Breath sounds: Normal breath sounds. No wheezing or rales.  Abdominal:     General: Bowel sounds are normal.     Palpations: Abdomen is soft.     Tenderness: There is no rebound.     Comments: Mild rt groin and RLQ ecchymosis. No discernible hematoma  Musculoskeletal:        General: No tenderness. Normal range of motion.     Right lower leg: No edema.     Left lower leg: No edema.  Lymphadenopathy:     Cervical: No cervical adenopathy.  Skin:    General: Skin is warm and dry.  Neurological:     Mental Status: She is alert and oriented to person, place, and time.     Cranial Nerves: No cranial nerve deficit.     Lab Results: BMP Recent Labs    01/30/21 1204 03/01/21 0909 03/12/21 1052 03/13/21 0301  NA 144 144 144  144 139  K 3.9 3.5 3.3*  3.2* 3.6  CL 105 104  --  107  CO2 24 27  --  24  GLUCOSE 104* 100*  --  138*  BUN 21 15  --  15  CREATININE  1.10* 1.20*  --  1.09*  CALCIUM 9.0 9.7  --  8.1*  GFRNONAA  --   --   --  50*     CBC Recent Labs  Lab 03/13/21 0301 03/13/21 1805  WBC 8.3  --   RBC 2.80*  --   HGB 8.6* 7.9*  HCT 26.3* 24.3*  PLT 239  --   MCV 93.9  --   MCH 30.7  --   MCHC 32.7  --   RDW 13.6  --      HEMOGLOBIN A1C No results found for: HGBA1C, MPG  Cardiac Panel (last 3 results) No results for input(s): CKTOTAL, CKMB, TROPONINI, RELINDX in the last 8760 hours.  BNP (last 3 results) No results for input(s): BNP in the last 8760 hours.  TSH No results for input(s): TSH in the last 8760 hours.  Lipid Panel     Component Value Date/Time   CHOL 129 03/01/2021 0909   TRIG 173 (H) 03/01/2021 0909   HDL 54 03/01/2021 0909   CHOLHDL 2.4 03/01/2021 0909   LDLCALC 47 03/01/2021 0909     Hepatic Function  Panel No results for input(s): PROT, ALBUMIN, AST, ALT, ALKPHOS, BILITOT, BILIDIR, IBILI in the last 8760 hours.   Cardiac Studies:  EKG 03/13/2021: Sinus rhythm Prolonged Qtc Otherwise normal EKG   RHC/LHC, coronary intervention 03/12/2021: LM: Normal LAD: Prox 70%, mid 80% disease         RFR 0.81, IVUS MLA 1.8 mm2 in mid LAD, 4.5 mm2 in prox LAD Lcx: No significant disease RCA: Mid 20% disease with mild calcification   Normal filling pressures No pulmonary hypertension   Intravascular ultrasound (IVUS) Shockwave lithotripsy PTCA and stent placement     Onyx Frontier 2.75X34 mm DES     Post dilatation proximally with 3.25 mm [place] balloon at 20 atm   0% residual stenosis. Excellent stent expansion and apposition MSA 6.1 mm2  Assessment & Recommendations:  85 year old Caucasian female with hypertension,  hyperlipidemia, hypothyroidism, history of recurrent DVT/PE, HFpEF, PSVT, NSVT, CAD s/p LAD PCI 03/12/2021, now with orthostatic hypotension, melena now reported that started before PCI  Melena: Suspect GI bleed.Orthostatic on 11/9. Hb 7.9. Hb 12 10 days ago. Anemia out of  proportion to peri-procedural blood loss.  Appreciate GI consult. Plan for EGD today. Continue plavix for 30 days past PCI, can be stopped after that given her bleeding. Hold eliquis for now. She has had recurrent DVT/PE in 2017 and 2019. Given her bleeding, still recommend discontinuation. Will obtain hematology consult outpatient. If eliquis must be restarted, could do so after plavix discontinuation in 30 days and resolution of bleeding source.  Had a long discussion with patient and daughters at bedside. Will await further GI recommendations.  CAD: S/p successful IVUS guided LAD PCI. Given GI bleeding that started before PCI, will limit antiplatelet therapy to plavix alone for 30 days.      Nigel Mormon, MD Pager: [phone] Office: [phone]

## 2021-03-14 NOTE — Op Note (Signed)
[facility] Patient Name: Jean King Procedure Date : 03/14/2021 MRN: [id number] Attending MD: Ronnette Juniper , MD Date of Birth: 1935/06/25 CSN: [id number] Age: 85 Admit Type: Inpatient Procedure:                Upper GI endoscopy Indications:              Iron deficiency anemia, Melena Providers:                Ronnette Juniper, MD, Doristine Johns, RN, Tyna Jaksch                            Technician Referring MD:             Joya Gaskins Patwardhan,MD Medicines:                Monitored Anesthesia Care Complications:            No immediate complications. Estimated Blood Loss:     Estimated blood loss: none. Procedure:                Pre-Anesthesia Assessment:                           - Prior to the procedure, a History and Physical                            was performed, and patient medications and                            allergies were reviewed. The patient's tolerance of                            previous anesthesia was also reviewed. The risks                            and benefits of the procedure and the sedation                            options and risks were discussed with the patient.                            All questions were answered, and informed consent                            was obtained. Prior Anticoagulants: The patient has                            taken Plavix (clopidogrel), last dose was day of                            procedure. ASA Grade Assessment: III - A patient                            with severe systemic disease. After reviewing the  risks and benefits, the patient was deemed in                            satisfactory condition to undergo the procedure.                           After obtaining informed consent, the endoscope was                            passed under direct vision. Throughout the                            procedure, the patient's blood pressure, pulse, and                             oxygen saturations were monitored continuously. The                            [id number]-[id number] ([id number] Olympus endoscope was introduced                            through the mouth, and advanced to the second part                            of duodenum. The upper GI endoscopy was                            accomplished without difficulty. The patient                            tolerated the procedure well. Scope In: Scope Out: Findings:      The upper third of the esophagus and middle third of the esophagus were       normal.      Few superficial esophageal ulcers with no bleeding and no stigmata of       recent bleeding were found 34 to 35 cm from the incisors. The largest       lesion was 6 mm in largest dimension. Biopsy is contraindicated because       as patient is on plavix.      Few non-bleeding superficial gastric ulcers with a clean ulcer base       (Forrest Class III), diminutive in size, were found in the gastric       antrum. Biopsy is contraindicated because as patient is on plavix.      Few non-bleeding superficial gastric ulcers with a clean ulcer base       (Forrest Class III) were found in the cardia. Biopsy is contraindicated       because as patient is on plavix.      The cardia and gastric fundus were otherwise normal on retroflexion.      The examined duodenum was normal. Impression:               - Normal upper third of esophagus and middle third  of esophagus.                           - Esophageal ulcers with no bleeding and no                            stigmata of recent bleeding. Biopsy is                            contraindicated.                           - Non-bleeding gastric ulcers with a clean ulcer                            base (Forrest Class III).                           - Non-bleeding gastric ulcers with a clean ulcer                            base (Forrest Class III).                           - Normal examined  duodenum.                           - No specimens collected. Moderate Sedation:      Patient did not receive moderate sedation for this procedure, but       instead received monitored anesthesia care. Recommendation:           - Resume regular diet.                           - Use Protonix (pantoprazole) 40 mg PO daily for 2                            months.                           - H pylori serology, treat if positive. Procedure Code(s):        --- Professional ---                           [phone], Esophagogastroduodenoscopy, flexible,                            transoral; diagnostic, including collection of                            specimen(s) by brushing or washing, when performed                            (separate procedure) Diagnosis Code(s):        --- Professional ---  K22.10, Ulcer of esophagus without bleeding                           K25.9, Gastric ulcer, unspecified as acute or                            chronic, without hemorrhage or perforation                           D50.9, Iron deficiency anemia, unspecified                           K92.1, Melena (includes Hematochezia) CPT copyright 2019 [employer] Association. All rights reserved. The codes documented in this report are preliminary and upon coder review may  be revised to meet current compliance requirements. Ronnette Juniper, MD 03/14/2021 2:02:15 PM This report has been signed electronically. Number of Addenda: 0

## 2021-03-14 NOTE — Transfer of Care (Signed)
Immediate Anesthesia Transfer of Care Note  Patient: Jean King  Procedure(s) Performed: ESOPHAGOGASTRODUODENOSCOPY (EGD) WITH PROPOFOL  Patient Location: PACU  Anesthesia Type:MAC  Level of Consciousness: drowsy  Airway & Oxygen Therapy: Patient Spontanous Breathing and Patient connected to face mask oxygen  Post-op Assessment: Report given to RN and Post -op Vital signs reviewed and stable  Post vital signs: Reviewed and stable  Last Vitals:  Vitals Value Taken Time  BP 116/42 03/14/21 1357  Temp    Pulse 64 03/14/21 1359  Resp 16 03/14/21 1359  SpO2 99 % 03/14/21 1359  Vitals shown include unvalidated device data.  Last Pain:  Vitals:   03/14/21 1357  TempSrc:   PainSc: 0-No pain      Patients Stated Pain Goal: 0 (20/10/07 1219)  Complications: No notable events documented.

## 2021-03-14 NOTE — Telephone Encounter (Signed)
Scheduled appt per 11/10 referral. Pt's daughter is aware of appt date and time.

## 2021-03-14 NOTE — Anesthesia Procedure Notes (Signed)
Procedure Name: MAC Date/Time: 03/14/2021 1:44 PM Performed by: Lieutenant Diego, CRNA Pre-anesthesia Checklist: Patient identified, Emergency Drugs available, Suction available, Patient being monitored and Timeout performed Patient Re-evaluated:Patient Re-evaluated prior to induction Oxygen Delivery Method: Simple face mask Preoxygenation: Pre-oxygenation with 100% oxygen Induction Type: IV induction

## 2021-03-14 NOTE — Interval H&P Note (Signed)
History and Physical Interval Note: 85/female with drop in HB, history of black stools, off eliquis for 4 days, on plavix for a recently placed DES for an EGD with propofol.  03/14/2021 1:32 PM  [name]  has presented today for EGD, with the diagnosis of MELENA, ANEMIA ON PLAVIX.  The various methods of treatment have been discussed with the patient and family. After consideration of risks, benefits and other options for treatment, the patient has consented to  Procedure(s): ESOPHAGOGASTRODUODENOSCOPY (EGD) WITH PROPOFOL (N/A) as a surgical intervention.  The patient's history has been reviewed, patient examined, no change in status, stable for surgery.  I have reviewed the patient's chart and labs.  Questions were answered to the patient's satisfaction.     Jean King

## 2021-03-14 NOTE — Anesthesia Preprocedure Evaluation (Signed)
Anesthesia Evaluation  Patient identified by MRN, date of birth, ID band Patient awake    Reviewed: Allergy & Precautions, NPO status , Patient's Chart, lab work & pertinent test results  Airway Mallampati: II  TM Distance: >3 FB Neck ROM: Full    Dental   Pulmonary neg pulmonary ROS,    breath sounds clear to auscultation       Cardiovascular hypertension, + angina + CAD and + Cardiac Stents   Rhythm:Regular Rate:Normal     Neuro/Psych  Headaches,    GI/Hepatic Neg liver ROS, GERD  ,  Endo/Other  Hypothyroidism   Renal/GU negative Renal ROS     Musculoskeletal   Abdominal   Peds  Hematology  (+) anemia ,   Anesthesia Other Findings   Reproductive/Obstetrics                             Anesthesia Physical Anesthesia Plan  ASA: 3  Anesthesia Plan: MAC   Post-op Pain Management:    Induction:   PONV Risk Score and Plan: 2 and Treatment may vary due to age or medical condition and Propofol infusion  Airway Management Planned: Natural Airway and Nasal Cannula  Additional Equipment: None  Intra-op Plan:   Post-operative Plan:   Informed Consent: I have reviewed the patients History and Physical, chart, labs and discussed the procedure including the risks, benefits and alternatives for the proposed anesthesia with the patient or authorized representative who has indicated his/her understanding and acceptance.       Plan Discussed with: CRNA  Anesthesia Plan Comments:         Anesthesia Quick Evaluation

## 2021-03-15 ENCOUNTER — Encounter (HOSPITAL_COMMUNITY): Payer: Self-pay | Admitting: Gastroenterology

## 2021-03-15 DIAGNOSIS — G4700 Insomnia, unspecified: Secondary | ICD-10-CM | POA: Diagnosis not present

## 2021-03-15 DIAGNOSIS — E039 Hypothyroidism, unspecified: Secondary | ICD-10-CM | POA: Diagnosis not present

## 2021-03-15 DIAGNOSIS — K219 Gastro-esophageal reflux disease without esophagitis: Secondary | ICD-10-CM | POA: Diagnosis not present

## 2021-03-15 DIAGNOSIS — I10 Essential (primary) hypertension: Secondary | ICD-10-CM | POA: Diagnosis not present

## 2021-03-15 DIAGNOSIS — I208 Other forms of angina pectoris: Secondary | ICD-10-CM | POA: Diagnosis not present

## 2021-03-15 DIAGNOSIS — E785 Hyperlipidemia, unspecified: Secondary | ICD-10-CM | POA: Diagnosis not present

## 2021-03-15 DIAGNOSIS — M1612 Unilateral primary osteoarthritis, left hip: Secondary | ICD-10-CM | POA: Diagnosis not present

## 2021-03-15 NOTE — Anesthesia Postprocedure Evaluation (Signed)
Anesthesia Post Note  Patient: Jean King  Procedure(s) Performed: ESOPHAGOGASTRODUODENOSCOPY (EGD) WITH PROPOFOL     Patient location during evaluation: PACU Anesthesia Type: MAC Level of consciousness: awake and alert Pain management: pain level controlled Vital Signs Assessment: post-procedure vital signs reviewed and stable Respiratory status: spontaneous breathing, nonlabored ventilation, respiratory function stable and patient connected to nasal cannula oxygen Cardiovascular status: stable and blood pressure returned to baseline Postop Assessment: no apparent nausea or vomiting Anesthetic complications: no   No notable events documented.  Last Vitals:  Vitals:   03/14/21 1446 03/14/21 1516  BP: 126/66 (!) 127/57  Pulse: 82 77  Resp: 15 16  Temp:    SpO2: 100%     Last Pain:  Vitals:   03/14/21 1427  TempSrc:   PainSc: 0-No pain                 Tiajuana Amass

## 2021-03-19 ENCOUNTER — Telehealth: Payer: Self-pay

## 2021-03-19 NOTE — Telephone Encounter (Signed)
Location of hospitalization: [facility] Reason for hospitalization: Heart cath appointment Date of discharge: 03/14/2021 Date of first communication with patient: today Person contacting patient: Gaye Alken, CMA Current symptoms: none Do you understand why you were in the Hospital: Yes Questions regarding discharge instructions: None Where were you discharged to: Home Medications reviewed: Yes Allergies reviewed: Yes Dietary changes reviewed: Yes. Discussed low fat and low salt diet.  Referals reviewed: NA Activities of Daily Living: Able to with mild limitations Any transportation issues/concerns: None Any patient concerns: None Confirmed importance & date/time of Follow up appt: Yes Confirmed with patient if condition begins to worsen call. Pt was given the office number and encouraged to call back with questions or concerns: Yes

## 2021-03-20 ENCOUNTER — Other Ambulatory Visit (HOSPITAL_COMMUNITY): Payer: Self-pay

## 2021-03-20 ENCOUNTER — Telehealth (HOSPITAL_COMMUNITY): Payer: Self-pay

## 2021-03-20 NOTE — Telephone Encounter (Signed)
Pharmacy Transitions of Care Follow-up Telephone Call  Date of discharge: 03/14/21  Discharge Diagnosis: Post PTCA  How have you been since you were released from the hospital?  Patient doing well since discharge, no questions about meds at this time. Patient's eye has been bothering her, encouraged her to call her eye doctor.  Medication changes made at discharge:     START taking: clopidogrel (Plavix)  pantoprazole (Protonix)  polyethylene glycol powder (GLYCOLAX/MIRALAX)   STOP taking: isosorbide mononitrate 30 MG 24 hr tablet (IMDUR)   Medication changes verified by the patient? Yes    Medication Accessibility:  Home Pharmacy:  Not discussed  Was the patient provided with refills on discharged medications? Yes   Have all prescriptions been transferred from [facility] to home pharmacy?  Patient wants to wait until after her MD appointment next week to do any transfers.  Is the patient able to afford medications? Has insurance    Medication Review:  APIXABAN (ELIQUIS)  Apixaban 5 mg BID initiated on 03/15/21.  - Discussed importance of taking medication around the same time everyday  - Advised patient of medications to avoid (NSAIDs, ASA)  - Educated that Tylenol (acetaminophen) will be the preferred analgesic to prevent risk of bleeding  - Emphasized importance of monitoring for signs and symptoms of bleeding (abnormal bruising, prolonged bleeding, nose bleeds, bleeding from gums, discolored urine, black tarry stools)  - Advised patient to alert all providers of anticoagulation therapy prior to starting a new medication or having a procedure   Follow-up Appointments:  PCP Hospital f/u appt confirmed? None currently scheduled  Specialist Hospital f/u appt confirmed? Scheduled to see Dr. Virgina Jock on 03/27/21 @ 2:15pm.   If their condition worsens, is the pt aware to call PCP or go to the Emergency Dept.? yes  Final Patient Assessment: Patient has follow up scheduled and  refills at home pharmacy

## 2021-03-21 DIAGNOSIS — K921 Melena: Secondary | ICD-10-CM | POA: Diagnosis not present

## 2021-03-22 LAB — HEMOGLOBIN: Hemoglobin: 8.6 g/dL — ABNORMAL LOW (ref 11.1–15.9)

## 2021-03-22 LAB — HEMATOCRIT: Hematocrit: 26.6 % — ABNORMAL LOW (ref 34.0–46.6)

## 2021-03-26 NOTE — Progress Notes (Signed)
[facility] NOTE  Patient Care Team: Josetta Huddle, MD as PCP - General (Internal Medicine)  CHIEF COMPLAINTS/PURPOSE OF CONSULTATION:  History of PE  HISTORY OF PRESENTING ILLNESS:  Jean King 85 y.o. female is here because of history of PE. Labs on 03/13/2021 showed Hgb 8.6, HCT, 26.2, and RBC 2.80. She presents to the clinic today for initial evaluation and discussion of treatment options.  She recent underwent heart catheterization and stent placement and apparently was placed on Plavix which led to severe rectal bleeding.  She was sent to [place] for evaluation whether she needs to remain on blood thinners long-term or not.  She tells me about significant family history of blood clots in both her father and brother.  She both the times that she had blood clots that were unprovoked.  Initial blood clot was in 2015 approximately in the leg and the second blood clot was in 2019.  She is here today accompanied by her son.  I reviewed her records extensively and collaborated the history with the patient.  MEDICAL HISTORY:  Past Medical History:  Diagnosis Date   Atherosclerosis of left carotid artery    Cataract    Chest pain    Depression    GERD (gastroesophageal reflux disease)    HTN (hypertension) 06/23/2010   Thyroid disease     SURGICAL HISTORY: Past Surgical History:  Procedure Laterality Date   ABDOMINAL HYSTERECTOMY     CORONARY STENT INTERVENTION N/A 03/12/2021   Procedure: CORONARY STENT INTERVENTION;  Surgeon: Nigel Mormon, MD;  Location: [place] [facility];  Service: Cardiovascular;  Laterality: N/A;   ESOPHAGOGASTRODUODENOSCOPY (EGD) WITH PROPOFOL N/A 03/14/2021   Procedure: ESOPHAGOGASTRODUODENOSCOPY (EGD) WITH PROPOFOL;  Surgeon: Ronnette Juniper, MD;  Location: [place];  Service: Gastroenterology;  Laterality: N/A;   INTRAVASCULAR LITHOTRIPSY  03/12/2021   Procedure: INTRAVASCULAR LITHOTRIPSY;  Surgeon: Nigel Mormon, MD;   Location: [place] [facility];  Service: Cardiovascular;;   INTRAVASCULAR PRESSURE WIRE/FFR STUDY N/A 03/12/2021   Procedure: INTRAVASCULAR PRESSURE WIRE/FFR STUDY;  Surgeon: Nigel Mormon, MD;  Location: [place] [facility];  Service: Cardiovascular;  Laterality: N/A;   INTRAVASCULAR ULTRASOUND/IVUS N/A 03/12/2021   Procedure: Intravascular Ultrasound/IVUS;  Surgeon: Nigel Mormon, MD;  Location: [place] [facility];  Service: Cardiovascular;  Laterality: N/A;   JOINT REPLACEMENT     KNEE ARTHROSCOPY     LIPOMA EXCISION     NOSE SURGERY     RIGHT/LEFT HEART CATH AND CORONARY ANGIOGRAPHY N/A 03/12/2021   Procedure: RIGHT/LEFT HEART CATH AND CORONARY ANGIOGRAPHY;  Surgeon: Nigel Mormon, MD;  Location: [place] [facility];  Service: Cardiovascular;  Laterality: N/A;   SPINE SURGERY      SOCIAL HISTORY: Social History   Socioeconomic History   Marital status: Widowed    Spouse name: Not on file   Number of children: 7   Years of education: Not on file   Highest education level: Not on file  Occupational History   Not on file  Tobacco Use   Smoking status: Never   Smokeless tobacco: Never  Vaping Use   Vaping Use: Never used  Substance and Sexual Activity   Alcohol use: No    Alcohol/week: 0.0 standard drinks   Drug use: Never   Sexual activity: Not on file  Other Topics Concern   Not on file  Social History Narrative   Not on file   Social Determinants of Health   Financial Resource Strain:  Not on file  Food Insecurity: Not on file  Transportation Needs: Not on file  Physical Activity: Not on file  Stress: Not on file  Social Connections: Not on file  Intimate Partner Violence: Not on file    FAMILY HISTORY: Family History  Problem Relation Age of Onset   Cancer Mother    Heart disease Father    Stroke Father    Heart attack Father    Cancer Sister    Heart disease Brother    Stroke Grandson     ALLERGIES:  is allergic to morphine, morphine  and related, bupropion, chlorhexidine, metoprolol tartrate, and scopolamine.  MEDICATIONS:  Current Outpatient Medications  Medication Sig Dispense Refill   acetaminophen (TYLENOL) 650 MG CR tablet Take 650-1,300 mg by mouth every 8 (eight) hours as needed for pain.     calcium-vitamin D (OSCAL WITH D) 500-200 MG-UNIT tablet Take 2 tablets by mouth daily with breakfast.     clopidogrel (PLAVIX) 75 MG tablet Take 1 tablet (75 mg total) by mouth daily. 30 tablet 1   diltiazem (CARDIZEM CD) 120 MG 24 hr capsule Take 1 capsule (120 mg total) by mouth daily. 90 capsule 3   escitalopram (LEXAPRO) 20 MG tablet Take 20 mg by mouth daily.      fluticasone (FLONASE) 50 MCG/ACT nasal spray Place 2 sprays into both nostrils daily as needed for allergies.  5   levothyroxine (SYNTHROID, LEVOTHROID) 75 MCG tablet Take 75 mcg by mouth daily before breakfast.  11   melatonin 5 MG TABS Take 10 mg by mouth at bedtime.     nitroGLYCERIN (NITROSTAT) 0.4 MG SL tablet Place 0.4 mg under the tongue every 5 (five) minutes as needed for chest pain.     pantoprazole (PROTONIX) 40 MG tablet Take 1 tablet (40 mg total) by mouth daily. 30 tablet 2   polyethylene glycol powder (GLYCOLAX/MIRALAX) 17 GM/SCOOP powder Take 17 g by mouth daily as needed for mild constipation or moderate constipation. 238 g 1   rosuvastatin (CRESTOR) 20 MG tablet Take 1 tablet (20 mg total) by mouth daily. 90 tablet 1   traMADol (ULTRAM) 50 MG tablet Take 50 mg by mouth every 6 (six) hours as needed for moderate pain.     traZODone (DESYREL) 100 MG tablet Take 50 mg by mouth at bedtime as needed for sleep.  2   No current facility-administered medications for this visit.    REVIEW OF SYSTEMS:   Constitutional: Denies fevers, chills or abnormal night sweats Bruising Rectal bleeding All other systems were reviewed with the patient and are negative.  PHYSICAL EXAMINATION: ECOG PERFORMANCE STATUS: 2 - Symptomatic, <50% confined to  bed  Vitals:   03/27/21 1528  BP: (!) 131/53  Pulse: 79  Resp: 18  Temp: 97.8 F (36.6 C)  SpO2: 99%   Filed Weights   03/27/21 1528  Weight: 195 lb 6.4 oz (88.6 kg)    LABORATORY DATA:  I have reviewed the data as listed Lab Results  Component Value Date   WBC 8.3 03/13/2021   HGB 8.6 (L) 03/21/2021   HCT 26.6 (L) 03/21/2021   MCV 93.9 03/13/2021   PLT 239 03/13/2021   Lab Results  Component Value Date   NA 139 03/13/2021   K 3.6 03/13/2021   CL 107 03/13/2021   CO2 24 03/13/2021    RADIOGRAPHIC STUDIES: I have personally reviewed the radiological reports and agreed with the findings in the report.  ASSESSMENT AND PLAN:  Pulmonary  embolus [facility]) 2015: DVT 02/15/2018: CT chest: Positive for acute bilateral right greater than left PE with CT evidence of right heart strain, DVT Current treatment: Eliquis Eliquis complications: Rectal bleeding after she got started on Plavix a month ago for cardiac stent Patient's father and her brother had blood clots  Plan: Hypercoagulability panel and antiphospholipid antibody panel If patient's bleeding is controlled then I would recommend that she remain on blood thinners irrespective of the hypercoagulability work-up.  However if the bleeding persists then we may have to consider the risks and benefits and might have to stop anticoagulation. The purpose of doing the hypercoagulability panel is primarily to determine risk for her family as well as to justify long-term anticoagulation.  Telephone visit on 04/05/2021 to discuss results   All questions were answered. The patient knows to call the clinic with any problems, questions or concerns.   Rulon Eisenmenger, MD, MPH 03/27/2021    I, Thana Ates, am acting as scribe for Nicholas Lose, MD.  I have reviewed the above documentation for accuracy and completeness, and I agree with the above.

## 2021-03-27 ENCOUNTER — Inpatient Hospital Stay: Payer: Medicare Other | Attending: Hematology and Oncology | Admitting: Hematology and Oncology

## 2021-03-27 ENCOUNTER — Telehealth: Payer: Self-pay | Admitting: *Deleted

## 2021-03-27 ENCOUNTER — Ambulatory Visit: Payer: Medicare Other | Admitting: Cardiology

## 2021-03-27 ENCOUNTER — Encounter: Payer: Self-pay | Admitting: Cardiology

## 2021-03-27 ENCOUNTER — Other Ambulatory Visit: Payer: Self-pay

## 2021-03-27 VITALS — BP 130/58 | HR 71 | Temp 98.0°F | Resp 16 | Ht 63.0 in | Wt 195.0 lb

## 2021-03-27 DIAGNOSIS — I2699 Other pulmonary embolism without acute cor pulmonale: Secondary | ICD-10-CM | POA: Insufficient documentation

## 2021-03-27 DIAGNOSIS — I25118 Atherosclerotic heart disease of native coronary artery with other forms of angina pectoris: Secondary | ICD-10-CM | POA: Diagnosis not present

## 2021-03-27 DIAGNOSIS — Z86718 Personal history of other venous thrombosis and embolism: Secondary | ICD-10-CM | POA: Insufficient documentation

## 2021-03-27 DIAGNOSIS — D62 Acute posthemorrhagic anemia: Secondary | ICD-10-CM

## 2021-03-27 DIAGNOSIS — E782 Mixed hyperlipidemia: Secondary | ICD-10-CM | POA: Diagnosis not present

## 2021-03-27 DIAGNOSIS — Z79899 Other long term (current) drug therapy: Secondary | ICD-10-CM | POA: Insufficient documentation

## 2021-03-27 DIAGNOSIS — Z86711 Personal history of pulmonary embolism: Secondary | ICD-10-CM | POA: Insufficient documentation

## 2021-03-27 DIAGNOSIS — Z7902 Long term (current) use of antithrombotics/antiplatelets: Secondary | ICD-10-CM | POA: Diagnosis not present

## 2021-03-27 DIAGNOSIS — Z8249 Family history of ischemic heart disease and other diseases of the circulatory system: Secondary | ICD-10-CM | POA: Insufficient documentation

## 2021-03-27 MED ORDER — ROSUVASTATIN CALCIUM 20 MG PO TABS: 20.0000 mg | ORAL_TABLET | Freq: Every day | ORAL | 1 refills | Status: DC

## 2021-03-27 MED ORDER — CLOPIDOGREL BISULFATE 75 MG PO TABS: 75.0000 mg | ORAL_TABLET | Freq: Every day | ORAL | 1 refills | Status: AC

## 2021-03-27 NOTE — Progress Notes (Signed)
Subjective:   Jean King, female    DOB: 03-26-1936, 85 y.o.   MRN: [id number]   HPI   Chief Complaint  Patient presents with   Chest Pain   Follow-up    Post cath   Coronary Artery Disease    85 year old Caucasian female with hypertension,  hyperlipidemia, CAD s/p LAD PCI (03/2021), rectal bleeding, hypothyroidism, history of recurrent DVT/PE, HFpEF, PSVT, NSVT  CTA showed no PE, but showed coronary calcification. No clear cause identified for exertional dyspnea. Currently on two anti anginal agents. Left and right heart catheterization, coronary angiography recommend for definitive evaluation and potential intervention, if necessary.     Patient underwent RFR and IVUS guided PCI to proximal to mid LAD with excellent results, MSA 6.1 mm post PCI. Patient had mild hematoma resolved with manual compression. Superficial ecchymosis remains in right groin and right lower quadrant of abdomen.  However, on the day of anticipated discharge, she was noted to be lightheaded and orthostatic, and was found to have orthostatic hypotension.  Further work-up showed blood loss anemia.  This was out of proportion to mild ecchymosis noted in the right groin.  On further questioning, patient reported having had melena 2 days before her coronary intervention procedure.  Suspecting GI bleeding, I consulted [facility] Dr. Therisa Doyne, who graciously saw the patient and performed EGD-that showed superficial ulcers with no active bleeding.  He was hemoglobin seen was 7.9 g/dL.  Patient did not require blood transfusion.  Gastric biopsy was not performed given ongoing use of Plavix.  H. pylori serology labs were sent.  Per GI, Eliquis could be resumed on 03/15/2021, provided patient is on PPI.  Plavix can be stopped in 30 days post PCI.  Given her recurrent DVT PE and now GI bleed, I referred her to hematology for further evaluation and to reassess long-term need for Eliquis.  Patient was discharged on 03/14/2021.  On  the day of discharge, she was not lightheaded, did not have orthostatic hypotension.  Detailed recommendations were discussed with patient and her daughters at length.    Patient is here today for transition care visit scheduled with me on 03/27/2021. Her breathing has improved since successful PCI. She has not had any chest pain. She did have bruising in her right grown and lower abdomen, which is also resolving. Her follow up CBC showed stable Hb. Unfortunately. She has had recurrent rectal bleeding, which is red colored for 3 days. She says today is a good day. She is currently taking plavix and eliquis. She has a consultation with hematology later today to opine about long tern anticoagulation recommendations in the setting of previous recurrent DVT.   Current Outpatient Medications on File Prior to Visit  Medication Sig Dispense Refill   acetaminophen (TYLENOL) 650 MG CR tablet Take 650-1,300 mg by mouth every 8 (eight) hours as needed for pain.     apixaban (ELIQUIS) 5 MG TABS tablet Take 1 tablet (5 mg total) by mouth 2 (two) times daily. 30 tablet 11   calcium-vitamin D (OSCAL WITH D) 500-200 MG-UNIT tablet Take 2 tablets by mouth daily with breakfast.     clopidogrel (PLAVIX) 75 MG tablet Take 1 tablet (75 mg total) by mouth daily. 30 tablet 1   diltiazem (CARDIZEM CD) 120 MG 24 hr capsule Take 1 capsule (120 mg total) by mouth daily. 90 capsule 3   escitalopram (LEXAPRO) 20 MG tablet Take 20 mg by mouth daily.      fluticasone (  FLONASE) 50 MCG/ACT nasal spray Place 2 sprays into both nostrils daily as needed for allergies.  5   levothyroxine (SYNTHROID, LEVOTHROID) 75 MCG tablet Take 75 mcg by mouth daily before breakfast.  11   melatonin 5 MG TABS Take 10 mg by mouth at bedtime.     nitroGLYCERIN (NITROSTAT) 0.4 MG SL tablet Place 0.4 mg under the tongue every 5 (five) minutes as needed for chest pain.     pantoprazole (PROTONIX) 40 MG tablet Take 1 tablet (40 mg total) by mouth daily. 30  tablet 2   polyethylene glycol powder (GLYCOLAX/MIRALAX) 17 GM/SCOOP powder Take 17 g by mouth daily as needed for mild constipation or moderate constipation. 238 g 1   rosuvastatin (CRESTOR) 10 MG tablet Take 2 tablets (20 mg total) by mouth daily. 90 tablet 3   traMADol (ULTRAM) 50 MG tablet Take 50 mg by mouth every 6 (six) hours as needed for moderate pain.     traZODone (DESYREL) 100 MG tablet Take 50 mg by mouth at bedtime as needed for sleep.  2   No current facility-administered medications on file prior to visit.    Cardiovascular & other pertient studies:  EKG 03/27/2021: Sinus rhythm 71 bpm Normal EKG  Coronary intervention 03/12/2021: LM: Normal LAD: Prox 70%, mid 80% disease         RFR 0.81, IVUS MLA 1.8 mm2 in mid LAD, 4.5 mm2 in prox LAD Lcx: No significant disease RCA: Mid 20% disease with mild calcification   Normal filling pressures No pulmonary hypertension   Intravascular ultrasound (IVUS) Shockwave lithotripsy PTCA and stent placement     Onyx Frontier 2.75X34 mm DES     Post dilatation proximally with 3.25 mm [place] balloon at 20 atm   0% residual stenosis. Excellent stent expansion and apposition MSA 6.1 mm2  CTA chest 02/19/2021: 1. Negative for acute PE or thoracic aortic dissection. 2. Coronary and Aortic Atherosclerosis (ICD10-170.0).  Mobile cardiac telemetry 13 days 07/11/2020 - 07/25/2020: Dominant rhythm: Sinus. HR 44-115 bpm. Avg HR 58 bpm, while in sinus rhythm. 36 episodes of SVT, fastest at 169 bpm for 8 beats, longest for 19 beats at 138 bpm. <1% isolated SVE, couplet/triplets. 1 episode of VT at 139 bpm for 6 beats (07/13/2020 3:58 PM) <1% isolated VE, couplet No atrial fibrillation/atrial flutter//high grade AV block, sinus pause >3sec noted. 1 patient triggered events, correlated with sinus rhythm.  Echocardiogram 01/20/2020:  Left ventricle cavity is normal in size. Moderate concentric hypertrophy  of the left ventricle. Normal global  wall motion. Normal LV systolic  function with visual EF 50-55%. Doppler evidence of grade I (impaired)  diastolic dysfunction, normal LAP.  Mild tricuspid regurgitation.  No evidence of pulmonary hypertension.  Compared to previous study on 07/20/2018, left atrial size and filling  pressures are lower.   Mobile cardiac telemetry 13 days 12/29/2019 - 01/12/2020: Dominant rhythm: Sinus. HR 45-171 bpm. Avg HR 65 bpm. 19 episodes of SVT, fastest at 171 bpm for 10 beats, longest for 16 beats at 123 bpm. <1% SVE burden. Rare PAC/PVC, <1% SVE & VE burden No atrial fibrillation/atrial flutter/SVT/VT/high grade AV block, sinus pause >3sec noted. 1 patient triggered events with PAC.   Carotid artery duplex  09/16/2019:  No evidence of significant stenosis in the right carotid vessels.  Stenosis in the left internal carotid artery (16-49%) with homogeneous  plaque.  Antegrade right vertebral artery flow. Antegrade left vertebral artery  flow.  Follow up in one year is appropriate if  clinically indicated.  Exercise myoview stress 07/16/2018:  1. Lexiscan stress test was performed. Exercise capacity was not assessed. Stress symptoms included dyspnea, dizziness. Resting blood pressure was 134/86 mmHg and peak effect blood pressure was 118/68 mmHg. The resting and stress electrocardiogram demonstrated normal sinus rhythm, normal resting conduction, no arrhythmias and normal repolarization.  Stress EKG is non diagnostic for ischemia as it is a pharmacologic stress.  2. The overall quality of the study is good. There is no evidence of abnormal lung activity. Stress and rest SPECT images demonstrate homogeneous tracer distribution throughout the myocardium. Gated SPECT imaging reveals normal myocardial thickening and wall motion. The left ventricular ejection fraction was normal (65%).   3. Low risk study.    Recent labs: 03/13/2021: Glucose 138, BUN/Cr 15/1.09. EGFR 50. Na/K 139/3.6.   Latest Reference  Range & Units 01/30/21 12:04 03/01/21 09:09 03/12/21 10:52 03/13/21 03:01 03/13/21 18:05 03/21/21 08:34 03/21/21 08:35  Hemoglobin 11.1 - 15.9 g/dL 12.0 12.4 9.9 (L) 9.5 (L) 8.6 (L) 7.9 (L)  8.6 (L)  HCT 34.0 - 46.6 % 36.2 37.9 29.0 (L) 28.0 (L) 26.3 (L) 24.3 (L) 26.6 (L)     06/27/2019: Glucose 90, BUN/Cr 22/1.1. EGFR 52 Chol 170, TG 228, HDL 53, LDL 79 TSH 1.3 normal    Review of Systems  Cardiovascular:  Negative for chest pain, dyspnea on exertion, leg swelling, palpitations and syncope.  Skin:        Bruising in right groin and lower abdomen  Gastrointestinal:        Bright red rectal bleeding        Vitals:   03/27/21 1050  BP: (!) 130/58  Pulse: 71  Resp: 16  Temp: 98 F (36.7 C)  SpO2: 98%    Body mass index is 34.54 kg/m. Filed Weights   03/27/21 1050  Weight: 195 lb (88.5 kg)     Objective:   Physical Exam Vitals and nursing note reviewed.  Constitutional:      General: She is not in acute distress.    Appearance: She is well-developed.  Neck:     Vascular: No JVD.  Cardiovascular:     Rate and Rhythm: Normal rate and regular rhythm.     Heart sounds: Normal heart sounds. No murmur heard.    Comments: Ecchymosis in right groin and lower abdomen Pulmonary:     Effort: Pulmonary effort is normal.     Breath sounds: Normal breath sounds. No wheezing or rales.  Musculoskeletal:     Right lower leg: No edema.     Left lower leg: No edema.  Neurological:     Mental Status: She is alert and oriented to person, place, and time.  Psychiatric:        Mood and Affect: Mood normal.        Assessment & Recommendations:   85 year old Caucasian female with hypertension,  hyperlipidemia, CAD s/p LAD PCI (03/2021), rectal bleeding, hypothyroidism, history of recurrent DVT/PE, HFpEF, PSVT, NSVT  CAD: S/p OCT guided prox LAD PCI Improvement in exertional dyspnea symptoms Currently on plavix, without Aspirin, given ongoing use of eliquis. Given her GI  bleeding, okay to stop plavix 30 days from PCI, ie stop plavix on 04/12/2021. Continue diltiazem 120 mg daily, Crestor 20 mg  GI bleeding; She has had recurrent bright red rectal bleeding. I recommend stopping eliquis (on for recurrent DVT history, last in 2019), pending further discussion with hematology.  She is not orthostatic or in any acute hemodynamic compromise. I encouraged  her to discuss with Dr. Encarnacion Slates office. I will forward my note to them.   F/u in 3 months, unless she is able to establish care with a Cardiologist in [facility]. [place] Pager: [phone] Office: [phone]

## 2021-03-27 NOTE — Assessment & Plan Note (Signed)
2015: DVT 02/15/2018: CT chest: Positive for acute bilateral right greater than left PE with CT evidence of right heart strain, DVT Current treatment: Eliquis Eliquis complications: Rectal bleeding after she got started on Plavix a month ago for cardiac stent Patient's father and her brother had blood clots  Plan: Hypercoagulability panel and antiphospholipid antibody panel If patient's bleeding is controlled then I would recommend that she remain on blood thinners irrespective of the hypercoagulability work-up.  However if the bleeding persists then we may have to consider the risks and benefits and might have to stop anticoagulation. The purpose of doing the hypercoagulability panel is primarily to determine risk for her family as well as to justify long-term anticoagulation.  Telephone visit on 04/05/2021 to discuss results

## 2021-03-27 NOTE — Telephone Encounter (Signed)
Lab appt unable to be made today per lab orders entered post visit today.  Pt lives in [place] and asked if she could have the needed labs drawn at a local lab core facility.  This RN gave pt a prescription for labs entered today per MD.  This pod's fax number provided for results as well as MD business card given with orders.

## 2021-03-29 ENCOUNTER — Telehealth: Payer: Self-pay | Admitting: Hematology and Oncology

## 2021-03-29 DIAGNOSIS — I2699 Other pulmonary embolism without acute cor pulmonale: Secondary | ICD-10-CM | POA: Diagnosis not present

## 2021-03-29 DIAGNOSIS — Z86711 Personal history of pulmonary embolism: Secondary | ICD-10-CM | POA: Diagnosis not present

## 2021-03-29 NOTE — Telephone Encounter (Signed)
Scheduled per sch msg. Called and left msg  

## 2021-04-01 ENCOUNTER — Other Ambulatory Visit: Payer: Medicare Other

## 2021-04-04 NOTE — Progress Notes (Signed)
  HEMATOLOGY-ONCOLOGY TELEPHONE VISIT PROGRESS NOTE  I connected with Jean King on 04/05/2021 at  2:00 PM EST by telephone and verified that I am speaking with the correct person using two identifiers.  I discussed the limitations, risks, security and privacy concerns of performing an evaluation and management service by telephone and the availability of in person appointments.  I also discussed with the patient that there may be a patient responsible charge related to this service. The patient expressed understanding and agreed to proceed.   History of Present Illness: Jean King is a 85 y.o. female with above-mentioned history of PE. She presents via telephone today for follow-up.  Observations/Objective:     Assessment Plan:  History of pulmonary embolus (PE) 2015: DVT 02/15/2018: CT chest: Positive for acute bilateral right greater than left PE with CT evidence of right heart strain, DVT Current treatment: Eliquis Eliquis complications: Rectal bleeding after she got started on Plavix a month ago for cardiac stent Patient's father and her brother had blood clots  Lab review:  Lupus anticoagulant: Negative Beta-2 glycoprotein 1 antibody: Negative Anticardiolipin antibody IgM positive (37) Protein C, protein S and Antithrombin III: Normal Factor V Leiden and prothrombin gene mutation: Normal  I discussed with the patient that the anticardiolipin antibody is positive in the mild-to-moderate range.  It could be a false positive test.  However if this does not change her decision that she needs lifelong anticoagulation.  Return to clinic on an as-needed basis.    I discussed the assessment and treatment plan with the patient. The patient was provided an opportunity to ask questions and all were answered. The patient agreed with the plan and demonstrated an understanding of the instructions. The patient was advised to call back or seek an in-person evaluation if the symptoms  worsen or if the condition fails to improve as anticipated.   Total time spent: 15 mins including non-face to face time and time spent for planning, charting and coordination of care  Rulon Eisenmenger, MD 04/05/2021    I, Thana Ates, am acting as scribe for Nicholas Lose, MD.  I have reviewed the above documentation for accuracy and completeness, and I agree with the above.

## 2021-04-05 ENCOUNTER — Inpatient Hospital Stay: Payer: Medicare Other | Attending: Hematology and Oncology | Admitting: Hematology and Oncology

## 2021-04-05 DIAGNOSIS — Z86711 Personal history of pulmonary embolism: Secondary | ICD-10-CM | POA: Diagnosis not present

## 2021-04-05 NOTE — Assessment & Plan Note (Signed)
2015: DVT 02/15/2018: CT chest: Positive for acute bilateral right greater than left PE with CT evidence of right heart strain, DVT Current treatment: Eliquis Eliquis complications: Rectal bleeding after she got started on Plavix a month ago for cardiac stent Patient's father and her brother had blood clots  Lab review:  Lupus anticoagulant: Negative Beta-2 glycoprotein 1 antibody: Negative Anticardiolipin antibody IgM positive (37) Protein C, protein S and Antithrombin III: Normal Factor V Leiden and prothrombin gene mutation: Normal  I discussed with the patient that the anticardiolipin antibody is positive in the mild-to-moderate range.  It could be a false positive test.  However if this does not change her decision that she needs lifelong anticoagulation.  Return to clinic on an as-needed basis.

## 2021-04-08 DIAGNOSIS — K2210 Ulcer of esophagus without bleeding: Secondary | ICD-10-CM | POA: Diagnosis not present

## 2021-04-15 DIAGNOSIS — M1612 Unilateral primary osteoarthritis, left hip: Secondary | ICD-10-CM | POA: Diagnosis not present

## 2021-04-15 DIAGNOSIS — G4700 Insomnia, unspecified: Secondary | ICD-10-CM | POA: Diagnosis not present

## 2021-04-15 DIAGNOSIS — I10 Essential (primary) hypertension: Secondary | ICD-10-CM | POA: Diagnosis not present

## 2021-04-15 DIAGNOSIS — I208 Other forms of angina pectoris: Secondary | ICD-10-CM | POA: Diagnosis not present

## 2021-04-15 DIAGNOSIS — E039 Hypothyroidism, unspecified: Secondary | ICD-10-CM | POA: Diagnosis not present

## 2021-04-15 DIAGNOSIS — E785 Hyperlipidemia, unspecified: Secondary | ICD-10-CM | POA: Diagnosis not present

## 2021-04-15 DIAGNOSIS — K219 Gastro-esophageal reflux disease without esophagitis: Secondary | ICD-10-CM | POA: Diagnosis not present

## 2021-04-22 NOTE — Telephone Encounter (Signed)
error 

## 2021-05-01 DIAGNOSIS — H02035 Senile entropion of left lower eyelid: Secondary | ICD-10-CM | POA: Diagnosis not present

## 2021-05-07 DIAGNOSIS — E785 Hyperlipidemia, unspecified: Secondary | ICD-10-CM | POA: Diagnosis not present

## 2021-05-07 DIAGNOSIS — K219 Gastro-esophageal reflux disease without esophagitis: Secondary | ICD-10-CM | POA: Diagnosis not present

## 2021-05-07 DIAGNOSIS — E039 Hypothyroidism, unspecified: Secondary | ICD-10-CM | POA: Diagnosis not present

## 2021-05-07 DIAGNOSIS — G4700 Insomnia, unspecified: Secondary | ICD-10-CM | POA: Diagnosis not present

## 2021-05-07 DIAGNOSIS — I10 Essential (primary) hypertension: Secondary | ICD-10-CM | POA: Diagnosis not present

## 2021-06-10 DIAGNOSIS — H02055 Trichiasis without entropian left lower eyelid: Secondary | ICD-10-CM | POA: Diagnosis not present

## 2021-06-10 DIAGNOSIS — H02535 Eyelid retraction left lower eyelid: Secondary | ICD-10-CM | POA: Diagnosis not present

## 2021-06-10 DIAGNOSIS — H57813 Brow ptosis, bilateral: Secondary | ICD-10-CM | POA: Diagnosis not present

## 2021-06-10 DIAGNOSIS — H02834 Dermatochalasis of left upper eyelid: Secondary | ICD-10-CM | POA: Diagnosis not present

## 2021-06-10 DIAGNOSIS — H02032 Senile entropion of right lower eyelid: Secondary | ICD-10-CM | POA: Diagnosis not present

## 2021-06-10 DIAGNOSIS — H02822 Cysts of right lower eyelid: Secondary | ICD-10-CM | POA: Diagnosis not present

## 2021-06-10 DIAGNOSIS — H02831 Dermatochalasis of right upper eyelid: Secondary | ICD-10-CM | POA: Diagnosis not present

## 2021-06-10 DIAGNOSIS — H02035 Senile entropion of left lower eyelid: Secondary | ICD-10-CM | POA: Diagnosis not present

## 2021-06-10 DIAGNOSIS — H02045 Spastic entropion of left lower eyelid: Secondary | ICD-10-CM | POA: Diagnosis not present

## 2021-06-10 DIAGNOSIS — H02825 Cysts of left lower eyelid: Secondary | ICD-10-CM | POA: Diagnosis not present

## 2021-06-10 DIAGNOSIS — H02532 Eyelid retraction right lower eyelid: Secondary | ICD-10-CM | POA: Diagnosis not present

## 2021-06-13 DIAGNOSIS — I10 Essential (primary) hypertension: Secondary | ICD-10-CM | POA: Diagnosis not present

## 2021-06-13 DIAGNOSIS — G4700 Insomnia, unspecified: Secondary | ICD-10-CM | POA: Diagnosis not present

## 2021-06-13 DIAGNOSIS — E785 Hyperlipidemia, unspecified: Secondary | ICD-10-CM | POA: Diagnosis not present

## 2021-07-15 DIAGNOSIS — G4700 Insomnia, unspecified: Secondary | ICD-10-CM | POA: Diagnosis not present

## 2021-07-15 DIAGNOSIS — E785 Hyperlipidemia, unspecified: Secondary | ICD-10-CM | POA: Diagnosis not present

## 2021-07-15 DIAGNOSIS — I10 Essential (primary) hypertension: Secondary | ICD-10-CM | POA: Diagnosis not present

## 2021-07-17 DIAGNOSIS — K219 Gastro-esophageal reflux disease without esophagitis: Secondary | ICD-10-CM | POA: Diagnosis not present

## 2021-07-17 DIAGNOSIS — G4700 Insomnia, unspecified: Secondary | ICD-10-CM | POA: Diagnosis not present

## 2021-07-17 DIAGNOSIS — I2699 Other pulmonary embolism without acute cor pulmonale: Secondary | ICD-10-CM | POA: Diagnosis not present

## 2021-07-17 DIAGNOSIS — I2510 Atherosclerotic heart disease of native coronary artery without angina pectoris: Secondary | ICD-10-CM | POA: Diagnosis not present

## 2021-07-17 DIAGNOSIS — E559 Vitamin D deficiency, unspecified: Secondary | ICD-10-CM | POA: Diagnosis not present

## 2021-07-17 DIAGNOSIS — M65341 Trigger finger, right ring finger: Secondary | ICD-10-CM | POA: Diagnosis not present

## 2021-07-17 DIAGNOSIS — I10 Essential (primary) hypertension: Secondary | ICD-10-CM | POA: Diagnosis not present

## 2021-07-17 DIAGNOSIS — M5416 Radiculopathy, lumbar region: Secondary | ICD-10-CM | POA: Diagnosis not present

## 2021-07-17 DIAGNOSIS — E785 Hyperlipidemia, unspecified: Secondary | ICD-10-CM | POA: Diagnosis not present

## 2021-07-17 DIAGNOSIS — E039 Hypothyroidism, unspecified: Secondary | ICD-10-CM | POA: Diagnosis not present

## 2021-07-22 DIAGNOSIS — H02535 Eyelid retraction left lower eyelid: Secondary | ICD-10-CM | POA: Diagnosis not present

## 2021-07-22 DIAGNOSIS — H02035 Senile entropion of left lower eyelid: Secondary | ICD-10-CM | POA: Diagnosis not present

## 2021-07-22 DIAGNOSIS — H02045 Spastic entropion of left lower eyelid: Secondary | ICD-10-CM | POA: Diagnosis not present

## 2021-08-15 DIAGNOSIS — I10 Essential (primary) hypertension: Secondary | ICD-10-CM | POA: Diagnosis not present

## 2021-08-15 DIAGNOSIS — E039 Hypothyroidism, unspecified: Secondary | ICD-10-CM | POA: Diagnosis not present

## 2021-08-15 DIAGNOSIS — G4700 Insomnia, unspecified: Secondary | ICD-10-CM | POA: Diagnosis not present

## 2021-08-15 DIAGNOSIS — E785 Hyperlipidemia, unspecified: Secondary | ICD-10-CM | POA: Diagnosis not present

## 2021-10-01 DIAGNOSIS — Z955 Presence of coronary angioplasty implant and graft: Secondary | ICD-10-CM | POA: Diagnosis not present

## 2021-10-01 DIAGNOSIS — I10 Essential (primary) hypertension: Secondary | ICD-10-CM | POA: Diagnosis not present

## 2021-10-01 DIAGNOSIS — I2510 Atherosclerotic heart disease of native coronary artery without angina pectoris: Secondary | ICD-10-CM | POA: Diagnosis not present

## 2021-10-01 DIAGNOSIS — I2782 Chronic pulmonary embolism: Secondary | ICD-10-CM | POA: Diagnosis not present

## 2021-10-01 DIAGNOSIS — Z79899 Other long term (current) drug therapy: Secondary | ICD-10-CM | POA: Diagnosis not present

## 2021-10-01 DIAGNOSIS — E785 Hyperlipidemia, unspecified: Secondary | ICD-10-CM | POA: Diagnosis not present

## 2021-10-01 DIAGNOSIS — I82509 Chronic embolism and thrombosis of unspecified deep veins of unspecified lower extremity: Secondary | ICD-10-CM | POA: Diagnosis not present

## 2021-10-01 DIAGNOSIS — Z9889 Other specified postprocedural states: Secondary | ICD-10-CM | POA: Diagnosis not present

## 2021-10-01 DIAGNOSIS — Z9861 Coronary angioplasty status: Secondary | ICD-10-CM | POA: Diagnosis not present

## 2021-10-01 DIAGNOSIS — Z7901 Long term (current) use of anticoagulants: Secondary | ICD-10-CM | POA: Diagnosis not present

## 2021-10-01 DIAGNOSIS — I471 Supraventricular tachycardia: Secondary | ICD-10-CM | POA: Diagnosis not present

## 2021-10-11 DIAGNOSIS — K219 Gastro-esophageal reflux disease without esophagitis: Secondary | ICD-10-CM | POA: Diagnosis not present

## 2021-10-11 DIAGNOSIS — E039 Hypothyroidism, unspecified: Secondary | ICD-10-CM | POA: Diagnosis not present

## 2021-10-11 DIAGNOSIS — I10 Essential (primary) hypertension: Secondary | ICD-10-CM | POA: Diagnosis not present

## 2021-10-11 DIAGNOSIS — I2510 Atherosclerotic heart disease of native coronary artery without angina pectoris: Secondary | ICD-10-CM | POA: Diagnosis not present

## 2021-10-11 DIAGNOSIS — E785 Hyperlipidemia, unspecified: Secondary | ICD-10-CM | POA: Diagnosis not present

## 2021-10-11 DIAGNOSIS — G4700 Insomnia, unspecified: Secondary | ICD-10-CM | POA: Diagnosis not present

## 2021-10-17 DIAGNOSIS — M5412 Radiculopathy, cervical region: Secondary | ICD-10-CM | POA: Diagnosis not present

## 2021-10-23 DIAGNOSIS — H02825 Cysts of left lower eyelid: Secondary | ICD-10-CM | POA: Diagnosis not present

## 2021-10-23 DIAGNOSIS — H57813 Brow ptosis, bilateral: Secondary | ICD-10-CM | POA: Diagnosis not present

## 2021-10-23 DIAGNOSIS — H02834 Dermatochalasis of left upper eyelid: Secondary | ICD-10-CM | POA: Diagnosis not present

## 2021-10-23 DIAGNOSIS — H02532 Eyelid retraction right lower eyelid: Secondary | ICD-10-CM | POA: Diagnosis not present

## 2021-10-23 DIAGNOSIS — H02831 Dermatochalasis of right upper eyelid: Secondary | ICD-10-CM | POA: Diagnosis not present

## 2021-10-23 DIAGNOSIS — H02032 Senile entropion of right lower eyelid: Secondary | ICD-10-CM | POA: Diagnosis not present

## 2021-11-07 ENCOUNTER — Other Ambulatory Visit: Payer: Self-pay | Admitting: Cardiology

## 2021-11-07 DIAGNOSIS — R002 Palpitations: Secondary | ICD-10-CM

## 2021-11-07 DIAGNOSIS — I471 Supraventricular tachycardia: Secondary | ICD-10-CM

## 2021-11-07 DIAGNOSIS — I25118 Atherosclerotic heart disease of native coronary artery with other forms of angina pectoris: Secondary | ICD-10-CM

## 2021-11-07 DIAGNOSIS — E782 Mixed hyperlipidemia: Secondary | ICD-10-CM

## 2021-11-13 DIAGNOSIS — E782 Mixed hyperlipidemia: Secondary | ICD-10-CM | POA: Diagnosis not present

## 2021-11-13 DIAGNOSIS — E039 Hypothyroidism, unspecified: Secondary | ICD-10-CM | POA: Diagnosis not present

## 2021-11-13 DIAGNOSIS — I4729 Other ventricular tachycardia: Secondary | ICD-10-CM | POA: Diagnosis not present

## 2021-11-13 DIAGNOSIS — Z86711 Personal history of pulmonary embolism: Secondary | ICD-10-CM | POA: Diagnosis not present

## 2021-11-13 DIAGNOSIS — D179 Benign lipomatous neoplasm, unspecified: Secondary | ICD-10-CM | POA: Diagnosis not present

## 2021-11-13 DIAGNOSIS — I10 Essential (primary) hypertension: Secondary | ICD-10-CM | POA: Diagnosis not present

## 2021-11-13 DIAGNOSIS — I5032 Chronic diastolic (congestive) heart failure: Secondary | ICD-10-CM | POA: Diagnosis not present

## 2021-11-13 DIAGNOSIS — R7303 Prediabetes: Secondary | ICD-10-CM | POA: Diagnosis not present

## 2021-11-20 DIAGNOSIS — I10 Essential (primary) hypertension: Secondary | ICD-10-CM | POA: Diagnosis not present

## 2021-11-20 DIAGNOSIS — E785 Hyperlipidemia, unspecified: Secondary | ICD-10-CM | POA: Diagnosis not present

## 2021-11-20 DIAGNOSIS — K219 Gastro-esophageal reflux disease without esophagitis: Secondary | ICD-10-CM | POA: Diagnosis not present

## 2021-11-20 DIAGNOSIS — E039 Hypothyroidism, unspecified: Secondary | ICD-10-CM | POA: Diagnosis not present

## 2022-01-08 ENCOUNTER — Other Ambulatory Visit: Payer: Self-pay | Admitting: Cardiology

## 2022-01-08 DIAGNOSIS — I25118 Atherosclerotic heart disease of native coronary artery with other forms of angina pectoris: Secondary | ICD-10-CM

## 2022-01-08 DIAGNOSIS — E782 Mixed hyperlipidemia: Secondary | ICD-10-CM

## 2022-01-10 DIAGNOSIS — M5459 Other low back pain: Secondary | ICD-10-CM | POA: Diagnosis not present

## 2022-02-03 DIAGNOSIS — H02825 Cysts of left lower eyelid: Secondary | ICD-10-CM | POA: Diagnosis not present

## 2022-02-03 DIAGNOSIS — H02532 Eyelid retraction right lower eyelid: Secondary | ICD-10-CM | POA: Diagnosis not present

## 2022-02-03 DIAGNOSIS — H0279 Other degenerative disorders of eyelid and periocular area: Secondary | ICD-10-CM | POA: Diagnosis not present

## 2022-02-03 DIAGNOSIS — H02032 Senile entropion of right lower eyelid: Secondary | ICD-10-CM | POA: Diagnosis not present

## 2022-02-03 DIAGNOSIS — H02015 Cicatricial entropion of left lower eyelid: Secondary | ICD-10-CM | POA: Diagnosis not present

## 2022-02-03 DIAGNOSIS — H02055 Trichiasis without entropian left lower eyelid: Secondary | ICD-10-CM | POA: Diagnosis not present

## 2022-02-03 DIAGNOSIS — H02834 Dermatochalasis of left upper eyelid: Secondary | ICD-10-CM | POA: Diagnosis not present

## 2022-02-03 DIAGNOSIS — H02045 Spastic entropion of left lower eyelid: Secondary | ICD-10-CM | POA: Diagnosis not present

## 2022-02-03 DIAGNOSIS — H02035 Senile entropion of left lower eyelid: Secondary | ICD-10-CM | POA: Diagnosis not present

## 2022-02-03 DIAGNOSIS — H02042 Spastic entropion of right lower eyelid: Secondary | ICD-10-CM | POA: Diagnosis not present

## 2022-02-03 DIAGNOSIS — H02535 Eyelid retraction left lower eyelid: Secondary | ICD-10-CM | POA: Diagnosis not present

## 2022-02-03 DIAGNOSIS — H02831 Dermatochalasis of right upper eyelid: Secondary | ICD-10-CM | POA: Diagnosis not present

## 2022-02-10 DIAGNOSIS — I10 Essential (primary) hypertension: Secondary | ICD-10-CM | POA: Diagnosis not present

## 2022-02-10 DIAGNOSIS — H35033 Hypertensive retinopathy, bilateral: Secondary | ICD-10-CM | POA: Diagnosis not present

## 2022-02-10 DIAGNOSIS — G4700 Insomnia, unspecified: Secondary | ICD-10-CM | POA: Diagnosis not present

## 2022-02-10 DIAGNOSIS — K219 Gastro-esophageal reflux disease without esophagitis: Secondary | ICD-10-CM | POA: Diagnosis not present

## 2022-02-10 DIAGNOSIS — E039 Hypothyroidism, unspecified: Secondary | ICD-10-CM | POA: Diagnosis not present

## 2022-02-10 DIAGNOSIS — H43811 Vitreous degeneration, right eye: Secondary | ICD-10-CM | POA: Diagnosis not present

## 2022-02-10 DIAGNOSIS — H353122 Nonexudative age-related macular degeneration, left eye, intermediate dry stage: Secondary | ICD-10-CM | POA: Diagnosis not present

## 2022-02-10 DIAGNOSIS — E785 Hyperlipidemia, unspecified: Secondary | ICD-10-CM | POA: Diagnosis not present

## 2022-02-11 ENCOUNTER — Telehealth: Payer: Self-pay

## 2022-02-11 NOTE — Patient Outreach (Signed)
  Care Coordination   02/11/2022 Name: Jean King MRN: [id number] DOB: Aug 10, 1935   Care Coordination Outreach Attempts:  An unsuccessful telephone outreach was attempted today to offer the patient information about available care coordination services as a benefit of their health plan.   Follow Up Plan:  Additional outreach attempts will be made to offer the patient care coordination information and services.   Encounter Outcome:  No Answer  Care Coordination Interventions Activated:  No   Care Coordination Interventions:  No, not indicated      Enzo Montgomery, RN,BSN,CCM [facility] Telephonic Care Management Coordinator Direct Phone: [phone] Toll Free: [phone] Fax: [phone]

## 2022-02-14 ENCOUNTER — Telehealth: Payer: Self-pay

## 2022-02-14 NOTE — Patient Outreach (Signed)
  Care Coordination   Initial Visit Note   02/14/2022 Name: Jean King MRN: [id number] DOB: 11/05/1935  Jean King is a 86 y.o. year old female who sees Josetta Huddle, MD for primary care. I spoke with  [name] by phone today.  What matters to the patients health and wellness today?  Patient reports that PCP retiring. She has already found a new PCP in the [place] area. Educated pt on importance of getting AWV and immunizations. She voiced she would follow up on those matters.     Goals Addressed             This Visit's Progress    COMPLETED: Care Coordination-no follow up required       Care Coordination Interventions: Advised patient to schedule Annual Wellness Visit and obtain immunizations Provided education to patient re: [facility] services          SDOH assessments and interventions completed:  No     Care Coordination Interventions Activated:  Yes  Care Coordination Interventions:  Yes, provided   Follow up plan: No further intervention required.   Encounter Outcome:  Pt. Visit Completed   Enzo Montgomery, RN,BSN,CCM [facility] Telephonic Care Management Coordinator Direct Phone: [phone] Toll Free: [phone] Fax: [phone]

## 2022-02-20 DIAGNOSIS — K5900 Constipation, unspecified: Secondary | ICD-10-CM | POA: Diagnosis not present

## 2022-02-20 DIAGNOSIS — N3000 Acute cystitis without hematuria: Secondary | ICD-10-CM | POA: Diagnosis not present

## 2022-02-24 DIAGNOSIS — H57813 Brow ptosis, bilateral: Secondary | ICD-10-CM | POA: Diagnosis not present

## 2022-02-24 DIAGNOSIS — H02015 Cicatricial entropion of left lower eyelid: Secondary | ICD-10-CM | POA: Diagnosis not present

## 2022-02-24 DIAGNOSIS — H02045 Spastic entropion of left lower eyelid: Secondary | ICD-10-CM | POA: Diagnosis not present

## 2022-02-24 DIAGNOSIS — H02035 Senile entropion of left lower eyelid: Secondary | ICD-10-CM | POA: Diagnosis not present

## 2022-02-24 DIAGNOSIS — H02055 Trichiasis without entropian left lower eyelid: Secondary | ICD-10-CM | POA: Diagnosis not present

## 2022-02-24 DIAGNOSIS — H02825 Cysts of left lower eyelid: Secondary | ICD-10-CM | POA: Diagnosis not present

## 2022-02-24 DIAGNOSIS — H02532 Eyelid retraction right lower eyelid: Secondary | ICD-10-CM | POA: Diagnosis not present

## 2022-02-24 DIAGNOSIS — H02535 Eyelid retraction left lower eyelid: Secondary | ICD-10-CM | POA: Diagnosis not present

## 2022-02-24 DIAGNOSIS — H02042 Spastic entropion of right lower eyelid: Secondary | ICD-10-CM | POA: Diagnosis not present

## 2022-02-24 DIAGNOSIS — H02831 Dermatochalasis of right upper eyelid: Secondary | ICD-10-CM | POA: Diagnosis not present

## 2022-02-24 DIAGNOSIS — H02834 Dermatochalasis of left upper eyelid: Secondary | ICD-10-CM | POA: Diagnosis not present

## 2022-02-24 DIAGNOSIS — H02032 Senile entropion of right lower eyelid: Secondary | ICD-10-CM | POA: Diagnosis not present

## 2022-03-06 ENCOUNTER — Other Ambulatory Visit: Payer: Self-pay | Admitting: Cardiology

## 2022-03-06 DIAGNOSIS — E782 Mixed hyperlipidemia: Secondary | ICD-10-CM

## 2022-03-06 DIAGNOSIS — I25118 Atherosclerotic heart disease of native coronary artery with other forms of angina pectoris: Secondary | ICD-10-CM

## 2022-03-13 DIAGNOSIS — M5136 Other intervertebral disc degeneration, lumbar region: Secondary | ICD-10-CM | POA: Diagnosis not present

## 2022-03-13 DIAGNOSIS — M47816 Spondylosis without myelopathy or radiculopathy, lumbar region: Secondary | ICD-10-CM | POA: Diagnosis not present

## 2022-03-17 DIAGNOSIS — M47816 Spondylosis without myelopathy or radiculopathy, lumbar region: Secondary | ICD-10-CM | POA: Diagnosis not present

## 2022-03-19 DIAGNOSIS — R92322 Mammographic fibroglandular density, left breast: Secondary | ICD-10-CM | POA: Diagnosis not present

## 2022-03-19 DIAGNOSIS — R92323 Mammographic fibroglandular density, bilateral breasts: Secondary | ICD-10-CM | POA: Diagnosis not present

## 2022-03-19 DIAGNOSIS — Z86018 Personal history of other benign neoplasm: Secondary | ICD-10-CM | POA: Diagnosis not present

## 2022-03-19 DIAGNOSIS — N644 Mastodynia: Secondary | ICD-10-CM | POA: Diagnosis not present

## 2022-03-19 DIAGNOSIS — M79622 Pain in left upper arm: Secondary | ICD-10-CM | POA: Diagnosis not present

## 2022-05-16 ENCOUNTER — Other Ambulatory Visit: Payer: Self-pay | Admitting: Cardiology

## 2022-05-16 DIAGNOSIS — R7303 Prediabetes: Secondary | ICD-10-CM | POA: Diagnosis not present

## 2022-05-16 DIAGNOSIS — E039 Hypothyroidism, unspecified: Secondary | ICD-10-CM | POA: Diagnosis not present

## 2022-05-16 DIAGNOSIS — E782 Mixed hyperlipidemia: Secondary | ICD-10-CM | POA: Diagnosis not present

## 2022-05-16 DIAGNOSIS — Z86711 Personal history of pulmonary embolism: Secondary | ICD-10-CM | POA: Diagnosis not present

## 2022-05-16 DIAGNOSIS — I10 Essential (primary) hypertension: Secondary | ICD-10-CM | POA: Diagnosis not present

## 2022-05-16 DIAGNOSIS — I5032 Chronic diastolic (congestive) heart failure: Secondary | ICD-10-CM | POA: Diagnosis not present

## 2022-05-16 DIAGNOSIS — Z0000 Encounter for general adult medical examination without abnormal findings: Secondary | ICD-10-CM | POA: Diagnosis not present

## 2022-05-16 DIAGNOSIS — R300 Dysuria: Secondary | ICD-10-CM | POA: Diagnosis not present

## 2022-05-16 DIAGNOSIS — I25118 Atherosclerotic heart disease of native coronary artery with other forms of angina pectoris: Secondary | ICD-10-CM

## 2022-05-26 DIAGNOSIS — M47816 Spondylosis without myelopathy or radiculopathy, lumbar region: Secondary | ICD-10-CM | POA: Diagnosis not present

## 2022-06-02 DIAGNOSIS — N390 Urinary tract infection, site not specified: Secondary | ICD-10-CM | POA: Diagnosis not present

## 2022-06-02 DIAGNOSIS — I10 Essential (primary) hypertension: Secondary | ICD-10-CM | POA: Diagnosis not present

## 2022-06-16 ENCOUNTER — Other Ambulatory Visit: Payer: Self-pay | Admitting: Cardiology

## 2022-06-16 DIAGNOSIS — I25118 Atherosclerotic heart disease of native coronary artery with other forms of angina pectoris: Secondary | ICD-10-CM

## 2022-06-16 DIAGNOSIS — E782 Mixed hyperlipidemia: Secondary | ICD-10-CM

## 2022-06-19 DIAGNOSIS — M65341 Trigger finger, right ring finger: Secondary | ICD-10-CM | POA: Diagnosis not present

## 2022-07-11 ENCOUNTER — Other Ambulatory Visit: Payer: Self-pay | Admitting: Cardiology

## 2022-07-11 DIAGNOSIS — I25118 Atherosclerotic heart disease of native coronary artery with other forms of angina pectoris: Secondary | ICD-10-CM

## 2022-07-11 DIAGNOSIS — E782 Mixed hyperlipidemia: Secondary | ICD-10-CM

## 2022-08-12 ENCOUNTER — Other Ambulatory Visit: Payer: Self-pay | Admitting: Cardiology

## 2022-08-12 DIAGNOSIS — E782 Mixed hyperlipidemia: Secondary | ICD-10-CM

## 2022-08-12 DIAGNOSIS — I25118 Atherosclerotic heart disease of native coronary artery with other forms of angina pectoris: Secondary | ICD-10-CM

## 2022-11-03 ENCOUNTER — Other Ambulatory Visit: Payer: Self-pay | Admitting: Cardiology

## 2022-11-03 DIAGNOSIS — R002 Palpitations: Secondary | ICD-10-CM

## 2022-11-03 DIAGNOSIS — I471 Supraventricular tachycardia, unspecified: Secondary | ICD-10-CM

## 2022-11-06 ENCOUNTER — Other Ambulatory Visit: Payer: Self-pay | Admitting: Cardiology

## 2022-11-06 DIAGNOSIS — I471 Supraventricular tachycardia, unspecified: Secondary | ICD-10-CM

## 2022-11-06 DIAGNOSIS — R002 Palpitations: Secondary | ICD-10-CM

## 2023-06-22 ENCOUNTER — Other Ambulatory Visit: Payer: Self-pay | Admitting: Cardiology

## 2023-06-22 DIAGNOSIS — E782 Mixed hyperlipidemia: Secondary | ICD-10-CM

## 2023-06-22 DIAGNOSIS — I25118 Atherosclerotic heart disease of native coronary artery with other forms of angina pectoris: Secondary | ICD-10-CM

## 2023-07-21 ENCOUNTER — Other Ambulatory Visit: Payer: Self-pay | Admitting: Cardiology

## 2023-07-21 DIAGNOSIS — E782 Mixed hyperlipidemia: Secondary | ICD-10-CM

## 2023-07-21 DIAGNOSIS — I25118 Atherosclerotic heart disease of native coronary artery with other forms of angina pectoris: Secondary | ICD-10-CM

## 2023-08-19 ENCOUNTER — Other Ambulatory Visit: Payer: Self-pay | Admitting: Cardiology

## 2023-08-19 DIAGNOSIS — E782 Mixed hyperlipidemia: Secondary | ICD-10-CM

## 2023-08-19 DIAGNOSIS — I25118 Atherosclerotic heart disease of native coronary artery with other forms of angina pectoris: Secondary | ICD-10-CM
